# Patient Record
Sex: Female | Born: 1950 | Race: White | Hispanic: Refuse to answer | Marital: Single | Smoking: Never smoker
Health system: Southern US, Community
[De-identification: ages and names within clinical notes are randomized; demographics above are authoritative.]

## PROBLEM LIST (undated history)

## (undated) DIAGNOSIS — E1165 Type 2 diabetes mellitus with hyperglycemia: Secondary | ICD-10-CM

## (undated) DIAGNOSIS — I10 Essential (primary) hypertension: Secondary | ICD-10-CM

## (undated) DIAGNOSIS — E039 Hypothyroidism, unspecified: Secondary | ICD-10-CM

## (undated) DIAGNOSIS — E785 Hyperlipidemia, unspecified: Secondary | ICD-10-CM

## (undated) DIAGNOSIS — F329 Major depressive disorder, single episode, unspecified: Secondary | ICD-10-CM

## (undated) DIAGNOSIS — N200 Calculus of kidney: Secondary | ICD-10-CM

## (undated) DIAGNOSIS — F32A Depression, unspecified: Secondary | ICD-10-CM

## (undated) HISTORY — DX: Type 2 diabetes mellitus with hyperglycemia: E11.65

## (undated) HISTORY — DX: Hypothyroidism, unspecified: E03.9

## (undated) HISTORY — DX: Hyperlipidemia, unspecified: E78.5

## (undated) HISTORY — DX: Depression, unspecified: F32.A

## (undated) HISTORY — DX: Essential (primary) hypertension: I10

## (undated) HISTORY — DX: Calculus of kidney: N20.0

## (undated) HISTORY — DX: Major depressive disorder, single episode, unspecified: F32.9

---
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
## 2006-04-17 DIAGNOSIS — E1165 Type 2 diabetes mellitus with hyperglycemia: Secondary | ICD-10-CM

## 2006-04-17 DIAGNOSIS — IMO0002 Reserved for concepts with insufficient information to code with codable children: Secondary | ICD-10-CM

## 2006-04-17 HISTORY — DX: Reserved for concepts with insufficient information to code with codable children: IMO0002

## 2006-04-17 HISTORY — DX: Type 2 diabetes mellitus with hyperglycemia: E11.65

## 2012-09-12 ENCOUNTER — Ambulatory Visit (INDEPENDENT_AMBULATORY_CARE_PROVIDER_SITE_OTHER): Payer: 59 | Admitting: Internal Medicine

## 2012-09-12 ENCOUNTER — Encounter: Payer: Self-pay | Admitting: Internal Medicine

## 2012-09-12 VITALS — BP 124/82 | HR 72 | Temp 97.9°F | Ht 64.5 in | Wt 181.0 lb

## 2012-09-12 DIAGNOSIS — F329 Major depressive disorder, single episode, unspecified: Secondary | ICD-10-CM | POA: Insufficient documentation

## 2012-09-12 DIAGNOSIS — E1129 Type 2 diabetes mellitus with other diabetic kidney complication: Secondary | ICD-10-CM | POA: Insufficient documentation

## 2012-09-12 DIAGNOSIS — R053 Chronic cough: Secondary | ICD-10-CM | POA: Insufficient documentation

## 2012-09-12 DIAGNOSIS — E1165 Type 2 diabetes mellitus with hyperglycemia: Secondary | ICD-10-CM | POA: Insufficient documentation

## 2012-09-12 DIAGNOSIS — E785 Hyperlipidemia, unspecified: Secondary | ICD-10-CM | POA: Insufficient documentation

## 2012-09-12 DIAGNOSIS — E782 Mixed hyperlipidemia: Secondary | ICD-10-CM | POA: Insufficient documentation

## 2012-09-12 DIAGNOSIS — E039 Hypothyroidism, unspecified: Secondary | ICD-10-CM | POA: Insufficient documentation

## 2012-09-12 DIAGNOSIS — I10 Essential (primary) hypertension: Secondary | ICD-10-CM

## 2012-09-12 DIAGNOSIS — R05 Cough: Secondary | ICD-10-CM

## 2012-09-12 DIAGNOSIS — Z23 Encounter for immunization: Secondary | ICD-10-CM

## 2012-09-12 MED ORDER — METFORMIN HCL 1000 MG PO TABS: 1000.0000 mg | ORAL_TABLET | Freq: Two times a day (BID) | ORAL | Status: DC

## 2012-09-12 MED ORDER — PRAVASTATIN SODIUM 80 MG PO TABS: 80.0000 mg | ORAL_TABLET | Freq: Every day | ORAL | Status: DC

## 2012-09-12 MED ORDER — VALSARTAN-HYDROCHLOROTHIAZIDE 160-25 MG PO TABS: 1.0000 | ORAL_TABLET | Freq: Every day | ORAL | Status: DC

## 2012-09-12 MED ORDER — GLUCOSE BLOOD VI STRP: 1.0000 | ORAL_STRIP | Freq: Every day | Status: DC

## 2012-09-12 MED ORDER — METOPROLOL SUCCINATE ER 50 MG PO TB24: 50.0000 mg | ORAL_TABLET | Freq: Every day | ORAL | Status: DC

## 2012-09-12 MED ORDER — FENOFIBRATE 54 MG PO TABS: 54.0000 mg | ORAL_TABLET | Freq: Every day | ORAL | Status: DC

## 2012-09-12 MED ORDER — SERTRALINE HCL 100 MG PO TABS: 100.0000 mg | ORAL_TABLET | Freq: Every day | ORAL | Status: DC

## 2012-09-12 NOTE — Assessment & Plan Note (Signed)
Patient's blood pressure is well-controlled however her chronic dry cough maybe secondary to ACE inhibitor. Discontinue lisinopril. Change to valsartan 160 mg.  Monitor electrolytes and kidney function. BP: 124/82 mmHg

## 2012-09-12 NOTE — Assessment & Plan Note (Signed)
Patient has mild wheezing on exam. Discontinue ACE inhibitor. Check chest x-ray. Switch to ARB

## 2012-09-12 NOTE — Assessment & Plan Note (Signed)
Patient has had type 2 diabetes since 2008. She has poor dietary compliance. She does not check her blood sugars on regular basis. Provided glucometer. Patient advised to check her fasting morning blood sugars. Continue metformin 1 g twice daily. Stressed importance of dietary compliance. Refer for diabetic counseling. Check A1c and microalbumin to creatinine ratio. Refer to ophthalmologist for diabetic eye exam.  Discontinue sulfonylurea for now. We discussed adding additional oral agents at next office visit.

## 2012-09-12 NOTE — Assessment & Plan Note (Signed)
Patient has been on Zoloft for greater than 10 years. She does not recall whether she started Zoloft after her first divorce or other life stressor.  Maintain same dose of sertraline.

## 2012-09-12 NOTE — Progress Notes (Signed)
Subjective:    Patient ID: Dawn Huang, female    DOB: July 09, 1950, 62 y.o.   MRN: [id number]  HPI  62 year old white female with history of type 2 diabetes, hypertension, hyperlipidemia hypothyroidism to establish. Patient recently moved from [place] to [facility] Patient reports being diagnosed with type 2 diabetes in 2008. Patient reports her blood sugars are not well-controlled. She does not monitor her blood sugar on a regular basis. She does not recall her last A1c. She denies knowledge of any diabetic complications.  Patient not compliant with diabetic diet.  She has mild tingling in her feet.  Patient currently taking metformin 1 g twice daily. She occasionally has loose stools after lunch. She ran out of her glipizide. She reports history of low blood sugars in the past. However she has not experienced any hypoglycemia recently.  Hypertension-her blood pressures are well controlled. She's been on lisinopril for several years. She has experienced dry chronic cough or several months. She denies any chest pain or shortness of breath. She has family history of coronary artery disease. She underwent stress testing in 2007/2008. Stress testing was normal.  Preventative health care-patient has not had mammogram, colon cancer screening, or routine Pap and pelvics  Review of Systems  Constitutional: Negative for activity change, appetite change, mild weight loss Eyes: Negative for visual disturbance.  Respiratory: Negative for chest tightness and shortness of breath.   Cardiovascular: Negative for chest pain.  Genitourinary: Negative for difficulty urinating.  Neurological: Negative for headaches.  Gastrointestinal: Negative for abdominal pain, heartburn melena or hematochezia Psych: Negative for depression or anxiety.  Patient has been on sertraline for many years Endo:  No polyuria or polydypsia    Past Medical History  Diagnosis Date  . Hyperlipidemia   . Hypertension    . Diabetes mellitus type 2, uncontrolled 2008  . Hypothyroidism   . Depression   . Nephrolithiasis     History   Social History  . Marital Status: Single    Spouse Name: N/A    Number of Children: N/A  . Years of Education: N/A   Occupational History  . Not on file.   Social History Main Topics  . Smoking status: Never Smoker   . Smokeless tobacco: Not on file  . Alcohol Use: No  . Drug Use: No  . Sexually Active: Not on file   Other Topics Concern  . Not on file   Social History Narrative   Patient is divorced, she was married twice in the past. She has a son age 62, daughter age 31 who lives in [place], daughter age 37 that lives in [place]   Patient moved to [place] area from [place] 2008    Past Surgical History  Procedure Laterality Date  . Tonsillectomy  1968    Family History  Problem Relation Age of Onset  . Coronary artery disease Father   . Coronary artery disease Brother   . Breast cancer Daughter     No Known Allergies  No current outpatient prescriptions on file prior to visit.   No current facility-administered medications on file prior to visit.    BP 124/82  Pulse 72  Temp(Src) 97.9 F (36.6 C) (Oral)  Ht 5' 4.5" (1.638 m)  Wt 181 lb (82.101 kg)  BMI 30.6 kg/m2       Objective:   Physical Exam  Constitutional: She is oriented to person, place, and time. She appears well-developed and well-nourished. No distress.  HENT:  Head: Normocephalic and  atraumatic.  Right Ear: External ear normal.  Left Ear: External ear normal.  Mouth/Throat: Oropharynx is clear and moist.  Eyes: Conjunctivae and EOM are normal. Pupils are equal, round, and reactive to light.  Neck: Neck supple.  No carotid bruit  Cardiovascular: Normal rate, regular rhythm and normal heart sounds.   No murmur heard. Pulmonary/Chest: Effort normal and breath sounds normal. She has no wheezes.  Abdominal: Soft. Bowel sounds are normal. She exhibits no mass.  There is no tenderness.  Musculoskeletal: She exhibits no edema.  Neurological: She is alert and oriented to person, place, and time. No cranial nerve deficit.  Skin: Skin is warm and dry.  Diabetic foot exam-no cracks or fissures, normal lower extremity pulses, mild decrease in temperature and vibration   Psychiatric: She has a normal mood and affect. Her behavior is normal.          Assessment & Plan:

## 2012-09-12 NOTE — Patient Instructions (Addendum)
Please complete the following lab tests before your next follow up appointment: BMET, A1C, microalbumin / cr ratio, CBCD - 250.02 TSH - 244.9 FLP, LFTs - 272.4

## 2012-09-12 NOTE — Assessment & Plan Note (Signed)
Monitor TFTs

## 2012-09-12 NOTE — Assessment & Plan Note (Signed)
Continue pravastatin and fenofibrate for now. Check fasting lipid panel and LFTs.

## 2012-09-13 ENCOUNTER — Telehealth: Payer: Self-pay | Admitting: Internal Medicine

## 2012-09-13 NOTE — Telephone Encounter (Signed)
BMET, A1C, microalbumin / cr ratio, CBCD - 250.02  TSH - 244.9  FLP, LFTs - 272.4  Please schedule lab appt

## 2012-09-13 NOTE — Telephone Encounter (Signed)
PT called and stated that Dr. Artist Pais requested that she have labs done, she doesn't know which ones. Please assist.

## 2012-09-16 ENCOUNTER — Other Ambulatory Visit (INDEPENDENT_AMBULATORY_CARE_PROVIDER_SITE_OTHER): Payer: 59

## 2012-09-16 DIAGNOSIS — E785 Hyperlipidemia, unspecified: Secondary | ICD-10-CM

## 2012-09-16 DIAGNOSIS — E039 Hypothyroidism, unspecified: Secondary | ICD-10-CM

## 2012-09-16 DIAGNOSIS — IMO0001 Reserved for inherently not codable concepts without codable children: Secondary | ICD-10-CM

## 2012-09-16 LAB — TSH: TSH: 8.66 u[IU]/mL — ABNORMAL HIGH (ref 0.35–5.50)

## 2012-09-16 LAB — HEPATIC FUNCTION PANEL
ALT: 23 U/L (ref 0–35)
AST: 22 U/L (ref 0–37)
Bilirubin, Direct: 0 mg/dL (ref 0.0–0.3)
Total Bilirubin: 1 mg/dL (ref 0.3–1.2)
Total Protein: 7.5 g/dL (ref 6.0–8.3)

## 2012-09-16 LAB — CBC WITH DIFFERENTIAL/PLATELET
Basophils Relative: 1 % (ref 0.0–3.0)
Eosinophils Relative: 4.7 % (ref 0.0–5.0)
HCT: 35.3 % — ABNORMAL LOW (ref 36.0–46.0)
MCV: 87.9 fl (ref 78.0–100.0)
Monocytes Absolute: 0.5 10*3/uL (ref 0.1–1.0)
Monocytes Relative: 8.7 % (ref 3.0–12.0)
Neutrophils Relative %: 61.5 % (ref 43.0–77.0)
RBC: 4.02 Mil/uL (ref 3.87–5.11)
WBC: 5.5 10*3/uL (ref 4.5–10.5)

## 2012-09-16 LAB — BASIC METABOLIC PANEL
Chloride: 106 mEq/L (ref 96–112)
GFR: 40.86 mL/min — ABNORMAL LOW (ref >60.00)
Potassium: 5 mEq/L (ref 3.5–5.1)

## 2012-09-16 LAB — LIPID PANEL
Cholesterol: 174 mg/dL (ref 0–200)
HDL: 47.6 mg/dL (ref >39.00)
Total CHOL/HDL Ratio: 4
Triglycerides: 206 mg/dL — ABNORMAL HIGH (ref 0.0–149.0)
VLDL: 41.2 mg/dL — ABNORMAL HIGH (ref 0.0–40.0)

## 2012-09-16 LAB — MICROALBUMIN / CREATININE URINE RATIO: Microalb Creat Ratio: 0.6 mg/g (ref 0.0–30.0)

## 2012-09-23 ENCOUNTER — Ambulatory Visit (INDEPENDENT_AMBULATORY_CARE_PROVIDER_SITE_OTHER)
Admission: RE | Admit: 2012-09-23 | Discharge: 2012-09-23 | Disposition: A | Discharge status: Home / Self Care | Payer: 59 | Source: Ambulatory Visit | Attending: Internal Medicine | Admitting: Internal Medicine

## 2012-09-23 DIAGNOSIS — R05 Cough: Secondary | ICD-10-CM

## 2012-10-02 ENCOUNTER — Encounter: Payer: Self-pay | Admitting: Internal Medicine

## 2012-10-03 ENCOUNTER — Ambulatory Visit: Payer: 59 | Admitting: Internal Medicine

## 2012-10-09 ENCOUNTER — Telehealth: Payer: Self-pay | Admitting: Internal Medicine

## 2012-10-09 MED ORDER — SERTRALINE HCL 100 MG PO TABS: 100.0000 mg | ORAL_TABLET | Freq: Every day | ORAL | Status: DC

## 2012-10-09 MED ORDER — LEVOTHYROXINE SODIUM 88 MCG PO TABS: 88.0000 ug | ORAL_TABLET | Freq: Every day | ORAL | Status: DC

## 2012-10-09 NOTE — Telephone Encounter (Signed)
rx's sent in electronically 

## 2012-10-09 NOTE — Telephone Encounter (Signed)
Pt called and stated that [facility] did not receive sertraline (ZOLOFT) 100 MG tablet, or her levothyroxine (SYNTHROID, LEVOTHROID) 88 MCG tablet. Please assist .

## 2012-10-15 ENCOUNTER — Telehealth: Payer: Self-pay | Admitting: Internal Medicine

## 2012-10-15 MED ORDER — SERTRALINE HCL 100 MG PO TABS: 100.0000 mg | ORAL_TABLET | Freq: Every day | ORAL | Status: DC

## 2012-10-15 MED ORDER — LEVOTHYROXINE SODIUM 88 MCG PO TABS: 88.0000 ug | ORAL_TABLET | Freq: Every day | ORAL | Status: DC

## 2012-10-15 NOTE — Telephone Encounter (Signed)
rx's resent electronically

## 2012-10-15 NOTE — Telephone Encounter (Signed)
PT called and stated that [facility] never received her RX 's of sertraline (ZOLOFT) 100 MG tablet, and levothyroxine (SYNTHROID, LEVOTHROID) 88 MCG tablet. Please re-send.

## 2012-10-17 ENCOUNTER — Telehealth: Payer: Self-pay | Admitting: Internal Medicine

## 2012-10-17 MED ORDER — SERTRALINE HCL 100 MG PO TABS: 100.0000 mg | ORAL_TABLET | Freq: Every day | ORAL | Status: DC

## 2012-10-17 MED ORDER — LEVOTHYROXINE SODIUM 88 MCG PO TABS: 88.0000 ug | ORAL_TABLET | Freq: Every day | ORAL | Status: DC

## 2012-10-17 NOTE — Telephone Encounter (Signed)
PT called and stated that her levothyroxine (SYNTHROID, LEVOTHROID) 88 MCG tablet, and sertraline (ZOLOFT) 100 MG tablet should be sent to [facility], as this is her new pharmacy. Please make her chart reflect these changes as well.

## 2012-10-17 NOTE — Telephone Encounter (Signed)
Chart updated and sent in electronically

## 2012-11-20 ENCOUNTER — Other Ambulatory Visit: Payer: Self-pay

## 2013-01-03 ENCOUNTER — Telehealth: Payer: Self-pay | Admitting: Internal Medicine

## 2013-01-03 MED ORDER — METFORMIN HCL 1000 MG PO TABS: 1000.0000 mg | ORAL_TABLET | Freq: Two times a day (BID) | ORAL | Status: DC

## 2013-01-03 NOTE — Telephone Encounter (Signed)
Pt is requesting a 1 month refill of her metFORMIN (GLUCOPHAGE) 1000 MG tablet, be sent to [facility]. Please assist.

## 2013-01-03 NOTE — Telephone Encounter (Signed)
done

## 2013-01-23 ENCOUNTER — Encounter: Payer: Self-pay | Admitting: Internal Medicine

## 2013-01-23 ENCOUNTER — Ambulatory Visit (INDEPENDENT_AMBULATORY_CARE_PROVIDER_SITE_OTHER): Payer: 59 | Admitting: Internal Medicine

## 2013-01-23 VITALS — BP 130/84 | HR 76 | Temp 98.2°F | Resp 16 | Ht 64.5 in | Wt 179.0 lb

## 2013-01-23 DIAGNOSIS — E039 Hypothyroidism, unspecified: Secondary | ICD-10-CM

## 2013-01-23 DIAGNOSIS — E785 Hyperlipidemia, unspecified: Secondary | ICD-10-CM

## 2013-01-23 DIAGNOSIS — I10 Essential (primary) hypertension: Secondary | ICD-10-CM

## 2013-01-23 DIAGNOSIS — Z23 Encounter for immunization: Secondary | ICD-10-CM

## 2013-01-23 MED ORDER — LIRAGLUTIDE 18 MG/3ML ~~LOC~~ SOPN: 1.2000 mg | PEN_INJECTOR | Freq: Every day | SUBCUTANEOUS | Status: DC

## 2013-01-23 MED ORDER — INSULIN PEN NEEDLE 31G X 8 MM MISC: Status: DC

## 2013-01-23 NOTE — Assessment & Plan Note (Signed)
Patient's ACE inhibitor discontinued and started on valsartan HCTZ. She mistakenly did not discontinue HydroDIURIL. Patient advised discontinue HydroDIURIL. Monitor electrolytes and kidney function. Patient also advised to monitor home blood pressure readings. If systolic blood pressure greater than 140, we may need to add low-dose amlodipine. BP: 130/84 mmHg

## 2013-01-23 NOTE — Progress Notes (Signed)
Subjective:    Patient ID: Dawn Huang, female    DOB: [birth year], 62 y.o.   MRN: [id number]  HPI  62 year old white female with history of uncontrolled type 2 diabetes, hypertension and hypothyroidism for followup. Patient's metformin dose was decreased to once a day. Patient's serum creatinine was 1.4. Medication reconciliation was performed by nursing staff. Patient's ACE inhibitor was switched to Diovan hydrochlorothiazide. Patient continued her HydroDIURIL.  Patient reports mild intermittent dizziness.  Despite discontinuing ACE inhibitor and switching to ARB, patient still reports intermittent dry cough. She has history of gastroesophageal reflux disease.  She has not been checking her blood sugars regularly. They have not changed much. She has decreased her carbohydrate intake and discontinued all soft drinks.  Hypothyroidism-her levothyroxine dose was increased due to elevated TSH.  Patient reports she will be receiving flu vaccine at work on 10/22  Review of Systems Negative for chest pain or shortness of breath, intermittent numbness in her toes bilaterally      Past Medical History  Diagnosis Date  . Hyperlipidemia   . Hypertension   . Diabetes mellitus type 2, uncontrolled 2008  . Hypothyroidism   . Depression   . Nephrolithiasis     History   Social History  . Marital Status: Single    Spouse Name: N/A    Number of Children: N/A  . Years of Education: N/A   Occupational History  . Not on file.   Social History Main Topics  . Smoking status: Never Smoker   . Smokeless tobacco: Not on file  . Alcohol Use: No  . Drug Use: No  . Sexual Activity: Not on file   Other Topics Concern  . Not on file   Social History Narrative   Patient is divorced, she was married twice in the past. She has a son age 40, daughter age 66 who lives in [place], daughter age 69 that lives in [place]   Patient moved to [place] area from [place] 2008    Past Surgical History   Procedure Laterality Date  . Tonsillectomy  1968    Family History  Problem Relation Age of Onset  . Coronary artery disease Father   . Coronary artery disease Brother   . Breast cancer Daughter     No Known Allergies  Current Outpatient Prescriptions on File Prior to Visit  Medication Sig Dispense Refill  . fenofibrate 54 MG tablet Take 1 tablet (54 mg total) by mouth daily.  90 tablet  1  . glucose blood (FREESTYLE LITE) test strip 1 each by Other route daily. Use as instructed  100 each  3  . levothyroxine (SYNTHROID, LEVOTHROID) 88 MCG tablet Take 1 tablet (88 mcg total) by mouth daily before breakfast.  90 tablet  1  . metFORMIN (GLUCOPHAGE) 1000 MG tablet Take 1 tablet (1,000 mg total) by mouth 2 (two) times daily with a meal.  180 tablet  1  . metoprolol succinate (TOPROL-XL) 50 MG 24 hr tablet Take 1 tablet (50 mg total) by mouth daily.  90 tablet  1  . pravastatin (PRAVACHOL) 80 MG tablet Take 1 tablet (80 mg total) by mouth daily.  90 tablet  1  . sertraline (ZOLOFT) 100 MG tablet Take 1 tablet (100 mg total) by mouth daily.  90 tablet  1  . valsartan-hydrochlorothiazide (DIOVAN-HCT) 160-25 MG per tablet Take 1 tablet by mouth daily.  90 tablet  1   No current facility-administered medications on file prior to visit.    BP  130/84  Pulse 76  Temp(Src) 98.2 F (36.8 C)  Resp 16  Ht 5' 4.5" (1.638 m)  Wt 179 lb (81.194 kg)  BMI 30.26 kg/m2    Objective:   Physical Exam  Constitutional: She is oriented to person, place, and time. She appears well-developed and well-nourished.  Neck: Neck supple.  Cardiovascular: Normal rate, regular rhythm, normal heart sounds and intact distal pulses.   No murmur heard. Pulmonary/Chest: Effort normal and breath sounds normal. She has no wheezes.  Musculoskeletal: She exhibits no edema.  Lymphadenopathy:    She has no cervical adenopathy.  Neurological: She is alert and oriented to person, place, and time. No cranial nerve  deficit.  Skin: Skin is warm and dry.  See diabetic foot exam  Psychiatric: She has a normal mood and affect. Her behavior is normal.          Assessment & Plan:

## 2013-01-23 NOTE — Assessment & Plan Note (Signed)
Levothyroxine dose increased to 88 mcg. Monitor TFTs

## 2013-01-23 NOTE — Patient Instructions (Addendum)
Please stop hydrodiuril (HCTZ) 25 mg Monitor your blood pressure Bring your glucometer to your next follow up appointment Bring all of your pill bottles / medications to your next follow up appointment Take Victoza 0.6 mg Burton once daily for 1 week, then increase to 1.2 mg  once daily

## 2013-01-23 NOTE — Assessment & Plan Note (Signed)
Patient's blood sugar is suboptimally controlled. Metformin dose decreased to borderline elevated serum creatinine. Add Victoza. Patient given sample and instructions on how to properly use Victoza pen.  We discussed common side effects. Monitor A1c.  Reassess in 1 month.  Patient advised to bring her glucometer to her next followup office visit. Patient deferred influenza vaccine today.   She reports she'll be receiving it from her employer on October 22.

## 2013-01-23 NOTE — Assessment & Plan Note (Signed)
No change in medication.   Lab Results  Component Value Date   CHOL 174 09/16/2012   HDL 47.60 09/16/2012   LDLDIRECT 100.5 09/16/2012   TRIG 206.0* 09/16/2012   CHOLHDL 4 09/16/2012   Lab Results  Component Value Date   ALT 23 09/16/2012   AST 22 09/16/2012   ALKPHOS 39 09/16/2012   BILITOT 1.0 09/16/2012

## 2013-01-24 LAB — BASIC METABOLIC PANEL
CO2: 26 mEq/L (ref 19–32)
Chloride: 105 mEq/L (ref 96–112)
Sodium: 142 mEq/L (ref 135–145)

## 2013-01-24 LAB — TSH: TSH: 4.26 u[IU]/mL (ref 0.35–5.50)

## 2013-01-24 LAB — HEMOGLOBIN A1C: Hgb A1c MFr Bld: 7.6 % — ABNORMAL HIGH (ref 4.6–6.5)

## 2013-01-24 MED ORDER — LEVOTHYROXINE SODIUM 112 MCG PO TABS: 112.0000 ug | ORAL_TABLET | Freq: Every day | ORAL | Status: DC

## 2013-01-24 NOTE — Addendum Note (Signed)
Addended by: Meda Coffee on: 01/24/2013 03:42 PM   Modules accepted: Orders

## 2013-01-28 ENCOUNTER — Other Ambulatory Visit: Payer: Self-pay | Admitting: Internal Medicine

## 2013-01-28 MED ORDER — LEVOTHYROXINE SODIUM 112 MCG PO TABS: 112.0000 ug | ORAL_TABLET | Freq: Every day | ORAL | Status: DC

## 2013-02-05 ENCOUNTER — Encounter: Payer: Self-pay | Admitting: Internal Medicine

## 2013-02-05 NOTE — Telephone Encounter (Signed)
Pls advise.  

## 2013-02-06 ENCOUNTER — Other Ambulatory Visit: Payer: Self-pay | Admitting: *Deleted

## 2013-02-06 MED ORDER — EXENATIDE 5 MCG/0.02ML ~~LOC~~ SOPN: 5.0000 ug | PEN_INJECTOR | Freq: Two times a day (BID) | SUBCUTANEOUS | Status: DC

## 2013-02-06 NOTE — Telephone Encounter (Signed)
Rx sent to pharmacy   

## 2013-02-17 ENCOUNTER — Encounter: Payer: Self-pay | Admitting: Internal Medicine

## 2013-02-20 ENCOUNTER — Other Ambulatory Visit: Payer: Self-pay

## 2013-02-20 ENCOUNTER — Ambulatory Visit: Payer: 59 | Admitting: Internal Medicine

## 2013-02-28 ENCOUNTER — Other Ambulatory Visit: Payer: Self-pay | Admitting: *Deleted

## 2013-02-28 MED ORDER — FENOFIBRATE 54 MG PO TABS: 54.0000 mg | ORAL_TABLET | Freq: Every day | ORAL | Status: DC

## 2013-02-28 MED ORDER — METOPROLOL SUCCINATE ER 50 MG PO TB24: 50.0000 mg | ORAL_TABLET | Freq: Every day | ORAL | Status: DC

## 2013-02-28 MED ORDER — SERTRALINE HCL 100 MG PO TABS: 100.0000 mg | ORAL_TABLET | Freq: Every day | ORAL | Status: DC

## 2013-02-28 MED ORDER — PRAVASTATIN SODIUM 80 MG PO TABS: 80.0000 mg | ORAL_TABLET | Freq: Every day | ORAL | Status: DC

## 2013-02-28 MED ORDER — GLUCOSE BLOOD VI STRP: 1.0000 | ORAL_STRIP | Freq: Every day | Status: DC

## 2013-02-28 NOTE — Addendum Note (Signed)
Addended by: Alfred Levins D on: 02/28/2013 05:20 PM   Modules accepted: Orders

## 2013-03-12 ENCOUNTER — Encounter: Payer: Self-pay | Admitting: Internal Medicine

## 2013-03-26 ENCOUNTER — Other Ambulatory Visit: Payer: Self-pay | Admitting: *Deleted

## 2013-04-11 ENCOUNTER — Telehealth: Payer: Self-pay | Admitting: Internal Medicine

## 2013-04-11 NOTE — Telephone Encounter (Signed)
Pt would like you to call her and advise her what meds she needs to be on. Pt states he changed some things and she is unclear of her med list. pls call

## 2013-04-11 NOTE — Telephone Encounter (Signed)
Pt is signed up on mychart.  She is just wanting list of her medications.  Told pt to sign into to her mychart and they should be on there.  Pt verbalized understanding and had no questions

## 2013-05-26 ENCOUNTER — Encounter: Payer: Self-pay | Admitting: Internal Medicine

## 2013-05-26 ENCOUNTER — Ambulatory Visit (INDEPENDENT_AMBULATORY_CARE_PROVIDER_SITE_OTHER): Payer: 59 | Admitting: Internal Medicine

## 2013-05-26 VITALS — BP 148/92 | HR 72 | Temp 97.9°F | Ht 64.5 in | Wt 184.0 lb

## 2013-05-26 DIAGNOSIS — E11319 Type 2 diabetes mellitus with unspecified diabetic retinopathy without macular edema: Secondary | ICD-10-CM

## 2013-05-26 DIAGNOSIS — E1165 Type 2 diabetes mellitus with hyperglycemia: Secondary | ICD-10-CM

## 2013-05-26 DIAGNOSIS — IMO0002 Reserved for concepts with insufficient information to code with codable children: Secondary | ICD-10-CM

## 2013-05-26 DIAGNOSIS — E1149 Type 2 diabetes mellitus with other diabetic neurological complication: Secondary | ICD-10-CM

## 2013-05-26 DIAGNOSIS — E1139 Type 2 diabetes mellitus with other diabetic ophthalmic complication: Secondary | ICD-10-CM

## 2013-05-26 DIAGNOSIS — Z2911 Encounter for prophylactic immunotherapy for respiratory syncytial virus (RSV): Secondary | ICD-10-CM

## 2013-05-26 DIAGNOSIS — I10 Essential (primary) hypertension: Secondary | ICD-10-CM

## 2013-05-26 DIAGNOSIS — E785 Hyperlipidemia, unspecified: Secondary | ICD-10-CM

## 2013-05-26 DIAGNOSIS — E039 Hypothyroidism, unspecified: Secondary | ICD-10-CM

## 2013-05-26 DIAGNOSIS — Z23 Encounter for immunization: Secondary | ICD-10-CM

## 2013-05-26 MED ORDER — VALSARTAN-HYDROCHLOROTHIAZIDE 160-25 MG PO TABS: 1.0000 | ORAL_TABLET | Freq: Every day | ORAL | Status: DC

## 2013-05-26 MED ORDER — ATORVASTATIN CALCIUM 20 MG PO TABS: 20.0000 mg | ORAL_TABLET | Freq: Every day | ORAL | Status: DC

## 2013-05-26 MED ORDER — AMLODIPINE BESYLATE 5 MG PO TABS: 5.0000 mg | ORAL_TABLET | Freq: Every day | ORAL | Status: DC

## 2013-05-26 NOTE — Assessment & Plan Note (Signed)
Monitor TSH.  Adjust levothyroxine dose accordingly.  Lab Results  Component Value Date   TSH 4.26 01/23/2013

## 2013-05-26 NOTE — Progress Notes (Signed)
Subjective:    Patient ID: Dawn Huang, female    DOB: 1951-04-14, 63 y.o.   MRN: [id number]  HPI  63 year old white female with history of uncontrolled type 2 diabetes, hypertension and hypothyroidism for followup.  Type diabetes-patient has not been checking her blood sugar on a regular basis. Patient needs a new glucometer. Patient reports taking her medication iregularly. She often forgets to use of Victoza.  She reports having difficulty controlling her appetite.  Patient reports completing diabetic eye exam by optometrist-Dr. Conley Rolls.  Patient reported to have background diabetic retinopathy.  Official report unavailable for review.  Hypertension-patient does not monitor her blood pressure at home. She reports good medication compliance. She is on valsartan hydrochlorothiazide 160/25 mg once daily.  Her cough has significantly improved.  Hyperlipidemia-stable.  She has tried taking pravastatin 80 mg and fenofibrate.   Review of Systems Negative for chest pain, negative for visual changes    Past Medical History  Diagnosis Date  . Hyperlipidemia   . Hypertension   . Diabetes mellitus type 2, uncontrolled 2008  . Hypothyroidism   . Depression   . Nephrolithiasis     History   Social History  . Marital Status: Single    Spouse Name: N/A    Number of Children: N/A  . Years of Education: N/A   Occupational History  . Not on file.   Social History Main Topics  . Smoking status: Never Smoker   . Smokeless tobacco: Not on file  . Alcohol Use: No  . Drug Use: No  . Sexual Activity: Not on file   Other Topics Concern  . Not on file   Social History Narrative   Patient is divorced, she was married twice in the past. She has a son age 55, daughter age 44 who lives in [place], daughter age 16 that lives in [place]   Patient moved to [place] area from [place] 2008    Past Surgical History  Procedure Laterality Date  . Tonsillectomy  1968    Family History  Problem  Relation Age of Onset  . Coronary artery disease Father   . Coronary artery disease Brother   . Breast cancer Daughter     No Known Allergies  Current Outpatient Prescriptions on File Prior to Visit  Medication Sig Dispense Refill  . fenofibrate 54 MG tablet Take 1 tablet (54 mg total) by mouth daily.  90 tablet  1  . glucose blood (FREESTYLE LITE) test strip 1 each by Other route daily.  100 each  3  . Insulin Pen Needle (AURORA PEN NEEDLES) 31G X 8 MM MISC Use once daily as directed  100 each  1  . levothyroxine (SYNTHROID, LEVOTHROID) 112 MCG tablet Take 1 tablet (112 mcg total) by mouth daily before breakfast.  90 tablet  2  . Liraglutide (VICTOZA) 18 MG/3ML SOPN Inject 1.2 mg into the skin daily.  6 mL  2  . metFORMIN (GLUCOPHAGE) 1000 MG tablet Take 1 tablet (1,000 mg total) by mouth daily.  180 tablet  1  . metoprolol succinate (TOPROL-XL) 50 MG 24 hr tablet Take 1 tablet (50 mg total) by mouth daily.  90 tablet  1  . pravastatin (PRAVACHOL) 80 MG tablet Take 1 tablet (80 mg total) by mouth daily.  90 tablet  1  . sertraline (ZOLOFT) 100 MG tablet Take 1 tablet (100 mg total) by mouth daily.  90 tablet  1   No current facility-administered medications on file prior to visit.  BP 148/92  Pulse 72  Temp(Src) 97.9 F (36.6 C) (Oral)  Ht 5' 4.5" (1.638 m)  Wt 184 lb (83.462 kg)  BMI 31.11 kg/m2    Objective:   Physical Exam  Constitutional: She is oriented to person, place, and time. She appears well-developed and well-nourished. No distress.  HENT:  Head: Normocephalic and atraumatic.  Neck:  No carotid bruit  Cardiovascular: Normal rate, regular rhythm and normal heart sounds.   No murmur heard. Pulmonary/Chest: Effort normal and breath sounds normal. She has no wheezes.  Musculoskeletal: She exhibits no edema.  Neurological: She is alert and oriented to person, place, and time. No cranial nerve deficit.  Skin: Skin is warm and dry.  Psychiatric: She has a normal  mood and affect. Her behavior is normal.          Assessment & Plan:

## 2013-05-26 NOTE — Assessment & Plan Note (Signed)
Patient not checking her blood sugar as directed. She is taking Victoza intermittently. I stressed importance of following carb modified diet and taking her medication regularly. New glucometer provided. Patient reports she was seen by optometrist and reported to have background diabetic retinopathy.  [facility] diabetic retinopathy. Monitor A1c. Monitor electrolytes and kidney function. Lab Results  Component Value Date   HGBA1C 7.6* 01/23/2013

## 2013-05-26 NOTE — Assessment & Plan Note (Signed)
Patient's blood pressure is suboptimally controlled. Blood pressure with manual cuff in right arm is 160/70. Continue valsartan hydrochlorothiazide 160/25 mg. Add amlodipine 5 mg once daily. Reassess in 6 weeks. Monitor electrolytes and kidney function. BP: 148/92 mmHg  Lab Results  Component Value Date   CREATININE 1.3* 01/23/2013

## 2013-05-26 NOTE — Progress Notes (Signed)
Pre visit review using our clinic review tool, if applicable. No additional management support is needed unless otherwise documented below in the visit note. 

## 2013-05-26 NOTE — Assessment & Plan Note (Addendum)
Patient switched from pravastatin to atorvastatin.  If her triglycerides improved with improved blood sugar control, consider to discontinue fenofibrate.

## 2013-05-27 ENCOUNTER — Telehealth: Payer: Self-pay | Admitting: Internal Medicine

## 2013-05-27 LAB — BASIC METABOLIC PANEL
BUN: 20 mg/dL (ref 6–23)
CO2: 25 mEq/L (ref 19–32)
Calcium: 9.5 mg/dL (ref 8.4–10.5)
Chloride: 105 mEq/L (ref 96–112)
Creatinine, Ser: 1.1 mg/dL (ref 0.4–1.2)
GFR: 52.96 mL/min — AB (ref >60.00)
Glucose, Bld: 207 mg/dL — ABNORMAL HIGH (ref 70–99)
Potassium: 4.3 mEq/L (ref 3.5–5.1)
Sodium: 139 mEq/L (ref 135–145)

## 2013-05-27 LAB — HEMOGLOBIN A1C: HEMOGLOBIN A1C: 7.3 % — AB (ref 4.6–6.5)

## 2013-05-27 LAB — TSH: TSH: 0.17 u[IU]/mL — ABNORMAL LOW (ref 0.35–5.50)

## 2013-05-27 NOTE — Telephone Encounter (Signed)
Relevant patient education assigned to patient using Emmi. ° °

## 2013-05-28 ENCOUNTER — Encounter: Payer: Self-pay | Admitting: Internal Medicine

## 2013-05-30 ENCOUNTER — Telehealth: Payer: Self-pay

## 2013-05-30 ENCOUNTER — Other Ambulatory Visit: Payer: Self-pay | Admitting: *Deleted

## 2013-05-30 MED ORDER — LEVOTHYROXINE SODIUM 100 MCG PO TABS: 100.0000 ug | ORAL_TABLET | Freq: Every day | ORAL | Status: DC

## 2013-05-30 NOTE — Telephone Encounter (Signed)
Relevant patient education assigned to patient using Emmi. ° °

## 2013-06-04 ENCOUNTER — Ambulatory Visit: Payer: 59 | Admitting: Internal Medicine

## 2013-07-14 ENCOUNTER — Other Ambulatory Visit: Payer: Self-pay | Admitting: Internal Medicine

## 2013-07-25 ENCOUNTER — Other Ambulatory Visit: Payer: Self-pay | Admitting: Internal Medicine

## 2013-07-25 ENCOUNTER — Encounter: Payer: Self-pay | Admitting: Internal Medicine

## 2013-07-25 MED ORDER — LEVOTHYROXINE SODIUM 100 MCG PO TABS: ORAL_TABLET | ORAL | Status: DC

## 2013-07-25 MED ORDER — METFORMIN HCL 1000 MG PO TABS: 1000.0000 mg | ORAL_TABLET | Freq: Every day | ORAL | Status: DC

## 2013-07-25 MED ORDER — METFORMIN HCL 1000 MG PO TABS: ORAL_TABLET | ORAL | Status: DC

## 2013-07-25 NOTE — Telephone Encounter (Signed)
Metformin 1000mg  has been refilled and sent to [facility]

## 2013-07-31 ENCOUNTER — Other Ambulatory Visit (INDEPENDENT_AMBULATORY_CARE_PROVIDER_SITE_OTHER): Payer: 59

## 2013-07-31 DIAGNOSIS — E039 Hypothyroidism, unspecified: Secondary | ICD-10-CM

## 2013-07-31 LAB — TSH: TSH: 0.16 u[IU]/mL — ABNORMAL LOW (ref 0.35–5.50)

## 2013-08-01 ENCOUNTER — Telehealth: Payer: Self-pay | Admitting: Internal Medicine

## 2013-08-01 NOTE — Telephone Encounter (Signed)
Correct directions were faxed to [facility]

## 2013-08-01 NOTE — Telephone Encounter (Signed)
Molly from [facility] called need verification of directions on metFORMIN (GLUCOPHAGE) 1000 MG tablet can be reached at [phone] ref # [id number]

## 2013-08-14 ENCOUNTER — Other Ambulatory Visit: Payer: Self-pay | Admitting: *Deleted

## 2013-08-14 MED ORDER — LEVOTHYROXINE SODIUM 75 MCG PO TABS: 75.0000 ug | ORAL_TABLET | Freq: Every day | ORAL | Status: DC

## 2013-08-21 ENCOUNTER — Other Ambulatory Visit: Payer: Self-pay | Admitting: Internal Medicine

## 2013-08-21 MED ORDER — LEVOTHYROXINE SODIUM 75 MCG PO TABS: 75.0000 ug | ORAL_TABLET | Freq: Every day | ORAL | Status: DC

## 2013-09-04 ENCOUNTER — Encounter: Payer: Self-pay | Admitting: Internal Medicine

## 2013-09-05 MED ORDER — EXENATIDE 5 MCG/0.02ML ~~LOC~~ SOPN: 5.0000 ug | PEN_INJECTOR | Freq: Two times a day (BID) | SUBCUTANEOUS | Status: DC

## 2013-09-23 ENCOUNTER — Other Ambulatory Visit: Payer: Self-pay | Admitting: Internal Medicine

## 2013-09-30 ENCOUNTER — Other Ambulatory Visit: Payer: Self-pay | Admitting: Internal Medicine

## 2013-10-14 ENCOUNTER — Other Ambulatory Visit (INDEPENDENT_AMBULATORY_CARE_PROVIDER_SITE_OTHER): Payer: 59

## 2013-10-14 DIAGNOSIS — E039 Hypothyroidism, unspecified: Secondary | ICD-10-CM

## 2013-10-14 LAB — TSH: TSH: 5.53 u[IU]/mL — ABNORMAL HIGH (ref 0.35–4.50)

## 2013-10-20 ENCOUNTER — Encounter: Payer: Self-pay | Admitting: Internal Medicine

## 2013-10-20 ENCOUNTER — Ambulatory Visit (INDEPENDENT_AMBULATORY_CARE_PROVIDER_SITE_OTHER): Payer: 59 | Admitting: Internal Medicine

## 2013-10-20 VITALS — BP 114/70 | HR 76 | Temp 98.4°F | Ht 64.5 in | Wt 184.0 lb

## 2013-10-20 DIAGNOSIS — E1149 Type 2 diabetes mellitus with other diabetic neurological complication: Secondary | ICD-10-CM

## 2013-10-20 DIAGNOSIS — I10 Essential (primary) hypertension: Secondary | ICD-10-CM

## 2013-10-20 DIAGNOSIS — IMO0002 Reserved for concepts with insufficient information to code with codable children: Secondary | ICD-10-CM

## 2013-10-20 DIAGNOSIS — E038 Other specified hypothyroidism: Secondary | ICD-10-CM

## 2013-10-20 DIAGNOSIS — E1165 Type 2 diabetes mellitus with hyperglycemia: Principal | ICD-10-CM

## 2013-10-20 MED ORDER — LEVOTHYROXINE SODIUM 88 MCG PO TABS: 88.0000 ug | ORAL_TABLET | Freq: Every day | ORAL | Status: DC

## 2013-10-20 MED ORDER — INSULIN DETEMIR 100 UNIT/ML FLEXPEN: PEN_INJECTOR | SUBCUTANEOUS | Status: DC

## 2013-10-20 MED ORDER — LEVOTHYROXINE SODIUM 125 MCG PO TABS: 125.0000 ug | ORAL_TABLET | Freq: Every day | ORAL | Status: DC

## 2013-10-20 MED ORDER — INSULIN LISPRO 100 UNIT/ML (KWIKPEN): PEN_INJECTOR | SUBCUTANEOUS | Status: DC

## 2013-10-20 NOTE — Assessment & Plan Note (Addendum)
Patient's blood sugar significantly worse since previous visit. Her fasting blood sugars are above 350 and sometimes greater than 400. She is failing metformin and Byetta.  DC Byetta.  Start basal and meal time insulin.  Start levemir at 10 units at bedtime.  Insulin teaching provided.  10 units of levemir given in the office.  Patient instructed to titrate Levemir higher by 3 units every other day until her fasting blood sugar is less than 150.  Patient also to use Humalog with evening meal.  Start with 5 units for now.   Check BMET and A1c.

## 2013-10-20 NOTE — Progress Notes (Signed)
Subjective:    Patient ID: Dawn BondSharon Redcay, female    DOB: 03/10/1951, 63 y.o.   MRN: [id number]  Diabetes  63 year old white female with history of type 2 diabetes uncontrolled, hypertension and hypothyroidism for routine followup. Patient has been taking combination of Glucophage and Byetta.  Her blood sugars have been significantly worse since previous visit. Her morning blood sugars are greater than 350 and occasionally in the 400s.  Patient reports she is avoiding sweets but has difficulty decreasing her carbohydrate intake (especially potatoes).  Hypertension-improved. We added amlodipine at last office visit. Patient reports intermittent lightheadedness.  Review of Systems Polyuria and polydipsia, fatigue    Past Medical History  Diagnosis Date  . Hyperlipidemia   . Hypertension   . Diabetes mellitus type 2, uncontrolled 2008  . Hypothyroidism   . Depression   . Nephrolithiasis     History   Social History  . Marital Status: Single    Spouse Name: N/A    Number of Children: N/A  . Years of Education: N/A   Occupational History  . Not on file.   Social History Main Topics  . Smoking status: Never Smoker   . Smokeless tobacco: Not on file  . Alcohol Use: No  . Drug Use: No  . Sexual Activity: Not on file   Other Topics Concern  . Not on file   Social History Narrative   Patient is divorced, she was married twice in the past. She has a son age 65, daughter age 65 who lives in [place], daughter age 65 that lives in [place]   Patient moved to [place] area from [place] 2008    Past Surgical History  Procedure Laterality Date  . Tonsillectomy  1968    Family History  Problem Relation Age of Onset  . Coronary artery disease Father   . Coronary artery disease Brother   . Breast cancer Daughter     No Known Allergies  Current Outpatient Prescriptions on File Prior to Visit  Medication Sig Dispense Refill  . amLODipine (NORVASC) 5 MG tablet Take 1  tablet by mouth  daily  90 tablet  3  . atorvastatin (LIPITOR) 20 MG tablet Take 1 tablet by mouth  daily  90 tablet  3  . fenofibrate 54 MG tablet Take 1 tablet by mouth  daily  90 tablet  1  . glucose blood (FREESTYLE LITE) test strip 1 each by Other route daily.  100 each  3  . Insulin Pen Needle (AURORA PEN NEEDLES) 31G X 8 MM MISC Use once daily as directed  100 each  1  . metFORMIN (GLUCOPHAGE) 1000 MG tablet Take 1 tablet by mouth  twice a day with a meal  180 tablet  1  . metoprolol succinate (TOPROL-XL) 50 MG 24 hr tablet Take 1 tablet by mouth  daily  90 tablet  3  . sertraline (ZOLOFT) 100 MG tablet Take 1 tablet by mouth  daily  90 tablet  1  . valsartan-hydrochlorothiazide (DIOVAN-HCT) 160-25 MG per tablet Take 1 tablet by mouth  daily  90 tablet  3   No current facility-administered medications on file prior to visit.    BP 114/70  Pulse 76  Temp(Src) 98.4 F (36.9 C) (Oral)  Ht 5' 4.5" (1.638 m)  Wt 184 lb (83.462 kg)  BMI 31.11 kg/m2    Objective:   Physical Exam  Constitutional: She is oriented to person, place, and time. She appears well-developed and well-nourished.  Cardiovascular: Normal  rate, regular rhythm and normal heart sounds.   No murmur heard. Pulmonary/Chest: Effort normal and breath sounds normal. She has no wheezes.  Musculoskeletal: She exhibits no edema.  Neurological: She is alert and oriented to person, place, and time. No cranial nerve deficit.  Psychiatric: She has a normal mood and affect. Her behavior is normal.        Assessment & Plan:

## 2013-10-20 NOTE — Assessment & Plan Note (Signed)
We performed medication reconciliation. Patient has been taking  levothyroxine 112 mcg once daily. Her TSH is slightly elevated. Increase to 125 mcg once daily. Lab Results  Component Value Date   TSH 5.53* 10/14/2013

## 2013-10-20 NOTE — Assessment & Plan Note (Signed)
Improved. BP: 114/70 mmHg  I suspect her lightheadedness will improve once polyuria and polydipsia resolves.

## 2013-10-20 NOTE — Progress Notes (Signed)
Pre visit review using our clinic review tool, if applicable. No additional management support is needed unless otherwise documented below in the visit note. 

## 2013-10-20 NOTE — Patient Instructions (Signed)
Increase your Levemir dose by 3 units every other day until your fasting blood sugar is less than 150. Do not use Humalog if you are not consuming carbohydrates for your evening meal.  Your blood sugar goal 2 hrs after you eat should be around 150.

## 2013-10-21 LAB — BASIC METABOLIC PANEL
BUN: 29 mg/dL — ABNORMAL HIGH (ref 6–23)
CALCIUM: 9.5 mg/dL (ref 8.4–10.5)
CO2: 30 meq/L (ref 19–32)
Chloride: 105 mEq/L (ref 96–112)
Creatinine, Ser: 1.4 mg/dL — ABNORMAL HIGH (ref 0.4–1.2)
GFR: 39.08 mL/min — ABNORMAL LOW (ref >60.00)
Glucose, Bld: 243 mg/dL — ABNORMAL HIGH (ref 70–99)
POTASSIUM: 4.5 meq/L (ref 3.5–5.1)
Sodium: 140 mEq/L (ref 135–145)

## 2013-10-21 LAB — HEMOGLOBIN A1C: Hgb A1c MFr Bld: 8.7 % — ABNORMAL HIGH (ref 4.6–6.5)

## 2013-10-21 LAB — MICROALBUMIN / CREATININE URINE RATIO
CREATININE, U: 126.4 mg/dL
MICROALB/CREAT RATIO: 0.2 mg/g (ref 0.0–30.0)
Microalb, Ur: 0.3 mg/dL (ref 0.0–1.9)

## 2013-10-21 MED ORDER — LEVOTHYROXINE SODIUM 88 MCG PO TABS: 88.0000 ug | ORAL_TABLET | Freq: Every day | ORAL | Status: DC

## 2013-10-21 NOTE — Addendum Note (Signed)
Addended by: Peter CongoKILLINGSWORTH, Adolphus Hanf T on: 10/21/2013 11:38 AM   Modules accepted: Orders

## 2013-10-29 ENCOUNTER — Other Ambulatory Visit: Payer: Self-pay | Admitting: Internal Medicine

## 2013-10-31 ENCOUNTER — Encounter: Payer: Self-pay | Admitting: Internal Medicine

## 2013-10-31 ENCOUNTER — Ambulatory Visit (INDEPENDENT_AMBULATORY_CARE_PROVIDER_SITE_OTHER): Payer: 59 | Admitting: Internal Medicine

## 2013-10-31 VITALS — BP 136/80 | HR 64 | Temp 98.3°F | Wt 179.0 lb

## 2013-10-31 DIAGNOSIS — E785 Hyperlipidemia, unspecified: Secondary | ICD-10-CM

## 2013-10-31 DIAGNOSIS — E1165 Type 2 diabetes mellitus with hyperglycemia: Principal | ICD-10-CM

## 2013-10-31 DIAGNOSIS — E1149 Type 2 diabetes mellitus with other diabetic neurological complication: Secondary | ICD-10-CM

## 2013-10-31 DIAGNOSIS — IMO0002 Reserved for concepts with insufficient information to code with codable children: Secondary | ICD-10-CM

## 2013-10-31 MED ORDER — INSULIN DETEMIR 100 UNIT/ML FLEXPEN: PEN_INJECTOR | SUBCUTANEOUS | Status: DC

## 2013-10-31 NOTE — Assessment & Plan Note (Addendum)
Patient's blood sugars improving. Increase Levemir to 30 units at bedtime and continue to use 5-10 units of Humalog before evening meal. Refer to diabetic educator.  Hypoglycemia teaching reviewed with patient.  Patient encouraged to contact office via MyChart with blood sugar readings next week.  Reassess in 3 weeks.  Lab Results  Component Value Date   HGBA1C 8.7* 10/20/2013   HGBA1C 7.3* 05/26/2013   HGBA1C 7.6* 01/23/2013   Lab Results  Component Value Date   MICROALBUR 0.3 10/20/2013   CREATININE 1.4* 10/20/2013

## 2013-10-31 NOTE — Assessment & Plan Note (Addendum)
Patient very close to goal.  Maintain same dose of atorvastatin.  Low saturated fat diet handout provided. Plan to repeat LDL within next 1-2 months.

## 2013-10-31 NOTE — Progress Notes (Signed)
Subjective:    Patient ID: Dawn Huang, female    DOB: 05/31/1950, 63 y.o.   MRN: [id number]  Diabetes  63 year old white female for followup regarding uncontrolled type 2 diabetes. Patient currently using 15 units of Levemir and 5-10 units of Humalog before evening meal. Her fasting blood sugars are still uses greater than 200-250. Last night her postprandial blood sugar was approximately 160 after using 10 units of Humalog.  She has not been to diabetic educator.  Review of Systems Negative for hypoglycemia   Past Medical History  Diagnosis Date  . Hyperlipidemia   . Hypertension   . Diabetes mellitus type 2, uncontrolled 2008  . Hypothyroidism   . Depression   . Nephrolithiasis     History   Social History  . Marital Status: Single    Spouse Name: N/A    Number of Children: N/A  . Years of Education: N/A   Occupational History  . Not on file.   Social History Main Topics  . Smoking status: Never Smoker   . Smokeless tobacco: Not on file  . Alcohol Use: No  . Drug Use: No  . Sexual Activity: Not on file   Other Topics Concern  . Not on file   Social History Narrative   Patient is divorced, she was married twice in the past. She has a son age 53, daughter age 5 who lives in [place], daughter age 57 that lives in [place]   Patient moved to [place] area from [place] 2008    Past Surgical History  Procedure Laterality Date  . Tonsillectomy  1968    Family History  Problem Relation Age of Onset  . Coronary artery disease Father   . Coronary artery disease Brother   . Breast cancer Daughter     No Known Allergies  Current Outpatient Prescriptions on File Prior to Visit  Medication Sig Dispense Refill  . amLODipine (NORVASC) 5 MG tablet Take 1 tablet by mouth  daily  90 tablet  3  . atorvastatin (LIPITOR) 20 MG tablet Take 1 tablet by mouth  daily  90 tablet  3  . fenofibrate 54 MG tablet Take 1 tablet by mouth  daily  90 tablet  1  . glucose  blood (FREESTYLE LITE) test strip 1 each by Other route daily.  100 each  3  . insulin lispro (HUMALOG) 100 UNIT/ML KiwkPen Use 5 to 10 units ten minutes before your evening meal  5 pen  5  . Insulin Pen Needle (AURORA PEN NEEDLES) 31G X 8 MM MISC Use once daily as directed  100 each  1  . levothyroxine (SYNTHROID, LEVOTHROID) 88 MCG tablet Take 1 tablet (88 mcg total) by mouth daily.  30 tablet  2  . metFORMIN (GLUCOPHAGE) 1000 MG tablet Take 1 tablet by mouth  twice a day with a meal  180 tablet  1  . metoprolol succinate (TOPROL-XL) 50 MG 24 hr tablet Take 1 tablet by mouth  daily  90 tablet  3  . sertraline (ZOLOFT) 100 MG tablet Take 1 tablet by mouth  daily  90 tablet  1  . valsartan-hydrochlorothiazide (DIOVAN-HCT) 160-25 MG per tablet Take 1 tablet by mouth  daily  90 tablet  3   No current facility-administered medications on file prior to visit.    BP 136/80  Pulse 64  Temp(Src) 98.3 F (36.8 C) (Oral)  Wt 179 lb (81.194 kg)      Objective:   Physical Exam  Constitutional:  She is oriented to person, place, and time. She appears well-developed and well-nourished.  Cardiovascular: Normal rate, regular rhythm and normal heart sounds.   Pulmonary/Chest: Effort normal and breath sounds normal. She has no wheezes.  Neurological: She is alert and oriented to person, place, and time. No cranial nerve deficit.  Psychiatric: She has a normal mood and affect. Her behavior is normal.          Assessment & Plan:

## 2013-10-31 NOTE — Progress Notes (Signed)
Pre visit review using our clinic review tool, if applicable. No additional management support is needed unless otherwise documented below in the visit note. 

## 2013-11-06 ENCOUNTER — Telehealth: Payer: Self-pay | Admitting: Internal Medicine

## 2013-11-06 NOTE — Telephone Encounter (Signed)
Message via mychart

## 2013-11-06 NOTE — Telephone Encounter (Signed)
I received this message from the pt, pt is needing to know does she take more insulin.   i received another insulin shipment yesterday that i am to take once a day. do i take that with the other two that i am now taking?

## 2013-11-10 ENCOUNTER — Encounter: Payer: Self-pay | Admitting: Internal Medicine

## 2013-11-11 ENCOUNTER — Other Ambulatory Visit: Payer: Self-pay | Admitting: *Deleted

## 2013-11-12 ENCOUNTER — Encounter: Payer: Self-pay | Admitting: Internal Medicine

## 2013-11-12 ENCOUNTER — Ambulatory Visit (INDEPENDENT_AMBULATORY_CARE_PROVIDER_SITE_OTHER): Payer: 59 | Admitting: Internal Medicine

## 2013-11-12 VITALS — BP 114/64 | HR 76 | Temp 98.3°F | Ht 64.5 in | Wt 180.0 lb

## 2013-11-12 DIAGNOSIS — E1149 Type 2 diabetes mellitus with other diabetic neurological complication: Secondary | ICD-10-CM

## 2013-11-12 DIAGNOSIS — R112 Nausea with vomiting, unspecified: Secondary | ICD-10-CM

## 2013-11-12 DIAGNOSIS — E1165 Type 2 diabetes mellitus with hyperglycemia: Secondary | ICD-10-CM

## 2013-11-12 DIAGNOSIS — I10 Essential (primary) hypertension: Secondary | ICD-10-CM

## 2013-11-12 DIAGNOSIS — R1013 Epigastric pain: Secondary | ICD-10-CM

## 2013-11-12 DIAGNOSIS — IMO0002 Reserved for concepts with insufficient information to code with codable children: Secondary | ICD-10-CM

## 2013-11-12 LAB — CBC WITH DIFFERENTIAL/PLATELET
BASOS PCT: 0.5 % (ref 0.0–3.0)
Basophils Absolute: 0 10*3/uL (ref 0.0–0.1)
Eosinophils Absolute: 0.3 10*3/uL (ref 0.0–0.7)
Eosinophils Relative: 5.5 % — ABNORMAL HIGH (ref 0.0–5.0)
HCT: 33 % — ABNORMAL LOW (ref 36.0–46.0)
HEMOGLOBIN: 11.3 g/dL — AB (ref 12.0–15.0)
LYMPHS PCT: 21.9 % (ref 12.0–46.0)
Lymphs Abs: 1.1 10*3/uL (ref 0.7–4.0)
MCHC: 34.3 g/dL (ref 30.0–36.0)
MCV: 88.2 fl (ref 78.0–100.0)
MONOS PCT: 8.5 % (ref 3.0–12.0)
Monocytes Absolute: 0.4 10*3/uL (ref 0.1–1.0)
NEUTROS ABS: 3.1 10*3/uL (ref 1.4–7.7)
Neutrophils Relative %: 63.6 % (ref 43.0–77.0)
Platelets: 195 10*3/uL (ref 150.0–400.0)
RBC: 3.74 Mil/uL — AB (ref 3.87–5.11)
RDW: 14.6 % (ref 11.5–15.5)
WBC: 4.9 10*3/uL (ref 4.0–10.5)

## 2013-11-12 LAB — HEPATIC FUNCTION PANEL
ALBUMIN: 4.3 g/dL (ref 3.5–5.2)
ALK PHOS: 47 U/L (ref 39–117)
ALT: 24 U/L (ref 0–35)
AST: 27 U/L (ref 0–37)
Bilirubin, Direct: 0.1 mg/dL (ref 0.0–0.3)
TOTAL PROTEIN: 7.6 g/dL (ref 6.0–8.3)
Total Bilirubin: 0.9 mg/dL (ref 0.2–1.2)

## 2013-11-12 LAB — BASIC METABOLIC PANEL
BUN: 27 mg/dL — ABNORMAL HIGH (ref 6–23)
CO2: 30 mEq/L (ref 19–32)
Calcium: 10.2 mg/dL (ref 8.4–10.5)
Chloride: 102 mEq/L (ref 96–112)
Creatinine, Ser: 1.4 mg/dL — ABNORMAL HIGH (ref 0.4–1.2)
GFR: 39.71 mL/min — AB (ref >60.00)
Glucose, Bld: 196 mg/dL — ABNORMAL HIGH (ref 70–99)
Potassium: 4.7 mEq/L (ref 3.5–5.1)
SODIUM: 138 meq/L (ref 135–145)

## 2013-11-12 LAB — LIPASE: Lipase: 37 U/L (ref 11.0–59.0)

## 2013-11-12 NOTE — Progress Notes (Signed)
Pre visit review using our clinic review tool, if applicable. No additional management support is needed unless otherwise documented below in the visit note. 

## 2013-11-12 NOTE — Assessment & Plan Note (Signed)
Patient's blood pressure is low normal and he is experiencing intermittent lightheadedness. Decrease valsartan hydrochlorothiazide dose by half.  BP: 114/64 mmHg

## 2013-11-12 NOTE — Assessment & Plan Note (Addendum)
63 year old white female with unexplained nausea vomiting and epigastric pain. She has minimal tenderness on exam. Obtain liver function tests, lipase, CBC with differential. Rule out gallbladder disease - obtain abdominal RUQ ultrasound.  Her symptoms resolving w/o intervention.  Patient held metformin as directed.  Restart medication at 500 mg bid with food.  Resume full dose only after GI symptoms completely resolved.

## 2013-11-12 NOTE — Assessment & Plan Note (Signed)
Blood sugar reading improving.  Increase Levemir and Humalog dose.

## 2013-11-12 NOTE — Progress Notes (Signed)
Subjective:    Patient ID: Dawn Huang, female    DOB: 1950/05/17, 63 y.o.   MRN: [id number]  HPI  63 year old white female with history of uncontrolled type 2 diabetes, hypertension and hyperlipidemia for followup. At previous visit we increased her dose of Levemir and added mealtime insulin.  Patient reports suffering from nausea and vomiting 4-5 days ago. She had difficulty keeping down any food. She held her insulin for 2 days. Her symptoms gradually seemed to improve. She complains of epigastric discomfort. It feels like "gas bubble".  Hypertension - occasionally feeling lightheaded.  Review of Systems Negative for fever or chills, negative for diarrhea    Past Medical History  Diagnosis Date  . Hyperlipidemia   . Hypertension   . Diabetes mellitus type 2, uncontrolled 2008  . Hypothyroidism   . Depression   . Nephrolithiasis     History   Social History  . Marital Status: Single    Spouse Name: N/A    Number of Children: N/A  . Years of Education: N/A   Occupational History  . Not on file.   Social History Main Topics  . Smoking status: Never Smoker   . Smokeless tobacco: Not on file  . Alcohol Use: No  . Drug Use: No  . Sexual Activity: Not on file   Other Topics Concern  . Not on file   Social History Narrative   Patient is divorced, she was married twice in the past. She has a son age 87, daughter age 53 who lives in [place], daughter age 28 that lives in [place]   Patient moved to [place] area from [place] 2008    Past Surgical History  Procedure Laterality Date  . Tonsillectomy  1968    Family History  Problem Relation Age of Onset  . Coronary artery disease Father   . Coronary artery disease Brother   . Breast cancer Daughter     No Known Allergies  Current Outpatient Prescriptions on File Prior to Visit  Medication Sig Dispense Refill  . amLODipine (NORVASC) 5 MG tablet Take 1 tablet by mouth  daily  90 tablet  3  . atorvastatin  (LIPITOR) 20 MG tablet Take 1 tablet by mouth  daily  90 tablet  3  . fenofibrate 54 MG tablet Take 1 tablet by mouth  daily  90 tablet  1  . glucose blood (FREESTYLE LITE) test strip 1 each by Other route daily.  100 each  3  . Insulin Pen Needle (AURORA PEN NEEDLES) 31G X 8 MM MISC Use once daily as directed  100 each  1  . levothyroxine (SYNTHROID, LEVOTHROID) 88 MCG tablet Take 1 tablet (88 mcg total) by mouth daily.  30 tablet  2  . metFORMIN (GLUCOPHAGE) 1000 MG tablet Take 1 tablet by mouth  twice a day with a meal  180 tablet  1  . metoprolol succinate (TOPROL-XL) 50 MG 24 hr tablet Take 1 tablet by mouth  daily  90 tablet  3  . sertraline (ZOLOFT) 100 MG tablet Take 1 tablet by mouth  daily  90 tablet  1   No current facility-administered medications on file prior to visit.    BP 114/64  Pulse 76  Temp(Src) 98.3 F (36.8 C) (Oral)  Ht 5' 4.5" (1.638 m)  Wt 180 lb (81.647 kg)  BMI 30.43 kg/m2    Objective:   Physical Exam  Constitutional: She is oriented to person, place, and time. She appears well-developed and well-nourished.  HENT:  Head: Normocephalic and atraumatic.  Mouth/Throat: Oropharynx is clear and moist.  Cardiovascular: Normal rate, regular rhythm and normal heart sounds.   No murmur heard. Pulmonary/Chest: Effort normal and breath sounds normal. She has no wheezes.  Abdominal: Soft. She exhibits no mass. There is no rebound and no guarding.  Mild epigastric tenderness   Musculoskeletal: She exhibits no edema.  Neurological: She is alert and oriented to person, place, and time. No cranial nerve deficit.  Skin: Skin is warm and dry.  Psychiatric: She has a normal mood and affect. Her behavior is normal.          Assessment & Plan:

## 2013-11-21 ENCOUNTER — Ambulatory Visit
Admission: RE | Admit: 2013-11-21 | Discharge: 2013-11-21 | Disposition: A | Discharge status: Home / Self Care | Payer: 59 | Source: Ambulatory Visit | Attending: Internal Medicine | Admitting: Internal Medicine

## 2013-11-21 DIAGNOSIS — R1013 Epigastric pain: Secondary | ICD-10-CM

## 2013-11-21 DIAGNOSIS — R112 Nausea with vomiting, unspecified: Secondary | ICD-10-CM

## 2013-11-28 ENCOUNTER — Ambulatory Visit: Payer: 59 | Admitting: Internal Medicine

## 2013-12-25 ENCOUNTER — Other Ambulatory Visit: Payer: 59

## 2014-01-02 ENCOUNTER — Other Ambulatory Visit: Payer: Self-pay | Admitting: Internal Medicine

## 2014-02-11 ENCOUNTER — Ambulatory Visit (INDEPENDENT_AMBULATORY_CARE_PROVIDER_SITE_OTHER): Payer: 59 | Admitting: Family Medicine

## 2014-02-11 ENCOUNTER — Encounter: Payer: Self-pay | Admitting: Family Medicine

## 2014-02-11 VITALS — BP 134/82 | HR 71 | Temp 98.3°F | Ht 64.5 in | Wt 182.0 lb

## 2014-02-11 DIAGNOSIS — J0100 Acute maxillary sinusitis, unspecified: Secondary | ICD-10-CM

## 2014-02-11 MED ORDER — AZITHROMYCIN 250 MG PO TABS: ORAL_TABLET | ORAL | Status: DC

## 2014-02-11 NOTE — Progress Notes (Signed)
Pre visit review using our clinic review tool, if applicable. No additional management support is needed unless otherwise documented below in the visit note. 

## 2014-02-11 NOTE — Progress Notes (Signed)
   Subjective:    Patient ID: Dawn BondSharon Huang, female    DOB: 11/05/1950, 63 y.o.   MRN: [id number]  HPI Here for 4 days of sinus pressure, PND, ST, and a dry cough.    Review of Systems  Constitutional: Negative.   HENT: Positive for congestion, postnasal drip and sinus pressure.   Eyes: Negative.   Respiratory: Positive for cough.        Objective:   Physical Exam  Constitutional: She appears well-developed and well-nourished.  HENT:  Right Ear: External ear normal.  Left Ear: External ear normal.  Nose: Nose normal.  Mouth/Throat: Oropharynx is clear and moist.  Eyes: Conjunctivae are normal.  Pulmonary/Chest: Effort normal and breath sounds normal.  Lymphadenopathy:    She has no cervical adenopathy.          Assessment & Plan:  Add Mucinex. Written out of work today and tomorrow.

## 2014-02-18 ENCOUNTER — Telehealth: Payer: Self-pay | Admitting: Internal Medicine

## 2014-02-18 MED ORDER — METFORMIN HCL 1000 MG PO TABS: ORAL_TABLET | ORAL | Status: DC

## 2014-02-18 NOTE — Telephone Encounter (Signed)
rx sent in electronically 

## 2014-02-18 NOTE — Telephone Encounter (Signed)
[facility] - [place], [place] - [address] is requesting re-fill on metFORMIN (GLUCOPHAGE) 1000 MG tablet

## 2014-03-18 ENCOUNTER — Other Ambulatory Visit: Payer: Self-pay | Admitting: Internal Medicine

## 2014-03-22 ENCOUNTER — Other Ambulatory Visit: Payer: Self-pay | Admitting: Internal Medicine

## 2014-05-13 ENCOUNTER — Ambulatory Visit: Payer: Self-pay | Admitting: Internal Medicine

## 2014-05-18 ENCOUNTER — Encounter: Payer: Self-pay | Admitting: Internal Medicine

## 2014-07-15 ENCOUNTER — Ambulatory Visit: Payer: Self-pay | Admitting: Internal Medicine

## 2014-07-22 ENCOUNTER — Encounter: Payer: Self-pay | Admitting: Internal Medicine

## 2014-07-22 ENCOUNTER — Ambulatory Visit (INDEPENDENT_AMBULATORY_CARE_PROVIDER_SITE_OTHER): Payer: 59 | Admitting: Internal Medicine

## 2014-07-22 VITALS — BP 124/72 | Temp 98.3°F | Ht 64.5 in | Wt 186.0 lb

## 2014-07-22 DIAGNOSIS — I10 Essential (primary) hypertension: Secondary | ICD-10-CM

## 2014-07-22 DIAGNOSIS — F329 Major depressive disorder, single episode, unspecified: Secondary | ICD-10-CM

## 2014-07-22 DIAGNOSIS — IMO0002 Reserved for concepts with insufficient information to code with codable children: Secondary | ICD-10-CM

## 2014-07-22 DIAGNOSIS — E1149 Type 2 diabetes mellitus with other diabetic neurological complication: Secondary | ICD-10-CM

## 2014-07-22 DIAGNOSIS — E1141 Type 2 diabetes mellitus with diabetic mononeuropathy: Secondary | ICD-10-CM

## 2014-07-22 DIAGNOSIS — E039 Hypothyroidism, unspecified: Secondary | ICD-10-CM | POA: Diagnosis not present

## 2014-07-22 DIAGNOSIS — E1165 Type 2 diabetes mellitus with hyperglycemia: Secondary | ICD-10-CM

## 2014-07-22 DIAGNOSIS — E785 Hyperlipidemia, unspecified: Secondary | ICD-10-CM | POA: Diagnosis not present

## 2014-07-22 DIAGNOSIS — F430 Acute stress reaction: Secondary | ICD-10-CM

## 2014-07-22 DIAGNOSIS — F32A Depression, unspecified: Secondary | ICD-10-CM

## 2014-07-22 MED ORDER — BAYER MICROLET LANCETS MISC: 1.0000 | Freq: Three times a day (TID) | Status: AC

## 2014-07-22 MED ORDER — INSULIN LISPRO 100 UNIT/ML (KWIKPEN): PEN_INJECTOR | SUBCUTANEOUS | Status: DC

## 2014-07-22 MED ORDER — INSULIN PEN NEEDLE 31G X 8 MM MISC: Status: DC

## 2014-07-22 MED ORDER — VALSARTAN-HYDROCHLOROTHIAZIDE 160-25 MG PO TABS: 0.5000 | ORAL_TABLET | Freq: Every day | ORAL | Status: DC

## 2014-07-22 MED ORDER — INSULIN DETEMIR 100 UNIT/ML FLEXPEN: PEN_INJECTOR | SUBCUTANEOUS | Status: DC

## 2014-07-22 MED ORDER — FENOFIBRATE 54 MG PO TABS: ORAL_TABLET | ORAL | Status: DC

## 2014-07-22 MED ORDER — LIRAGLUTIDE 18 MG/3ML ~~LOC~~ SOPN: PEN_INJECTOR | SUBCUTANEOUS | Status: DC

## 2014-07-22 MED ORDER — GLUCOSE BLOOD VI STRP: 1.0000 | ORAL_STRIP | Freq: Three times a day (TID) | Status: AC

## 2014-07-22 MED ORDER — ATORVASTATIN CALCIUM 20 MG PO TABS: ORAL_TABLET | ORAL | Status: DC

## 2014-07-22 MED ORDER — SERTRALINE HCL 100 MG PO TABS: ORAL_TABLET | ORAL | Status: DC

## 2014-07-22 MED ORDER — METFORMIN HCL 1000 MG PO TABS: ORAL_TABLET | ORAL | Status: DC

## 2014-07-22 MED ORDER — AMLODIPINE BESYLATE 5 MG PO TABS: 5.0000 mg | ORAL_TABLET | Freq: Every day | ORAL | Status: DC

## 2014-07-22 MED ORDER — METOPROLOL SUCCINATE ER 50 MG PO TB24: ORAL_TABLET | ORAL | Status: DC

## 2014-07-22 NOTE — Assessment & Plan Note (Signed)
Monitor TFTs.  Adjust thyroid replacement dose accordingly.

## 2014-07-22 NOTE — Assessment & Plan Note (Signed)
Patient will restart checking her blood sugars 3 times daily. Depending on postprandial blood sugars, patient will restart mealtime insulin. She understands she may require lower dose.

## 2014-07-22 NOTE — Progress Notes (Signed)
Subjective:    Patient ID: Dawn Huang, female    DOB: 04-17-1951, 64 y.o.   MRN: [id number]  HPI  64 year old white female with history of uncontrolled type 2 diabetes, hypertension, depression and hypothyroidism for follow-up.  Has been up approximately 9 months since her last follow-up visit. Patient reports significant new life stressors. Her daughter who is 16 years old diagnosed with metastatic breast cancer. Her daughter lives in [place].  It has been 3 months since she last checked her blood sugar. She continues to take her medications and long-acting insulin. She ran out of her mealtime insulin. She denies any hypoglycemia.  Depression-she is taking sertraline 100 mg once daily. She is having difficulty managing stress reaction.  Hypertension - stable  Review of Systems Negative for chest pain.  Negative for changes in vision. Negative for paresthesias    Past Medical History  Diagnosis Date  . Hyperlipidemia   . Hypertension   . Diabetes mellitus type 2, uncontrolled 2008  . Hypothyroidism   . Depression   . Nephrolithiasis     History   Social History  . Marital Status: Single    Spouse Name: N/A  . Number of Children: N/A  . Years of Education: N/A   Occupational History  . Not on file.   Social History Main Topics  . Smoking status: Never Smoker   . Smokeless tobacco: Never Used  . Alcohol Use: No  . Drug Use: No  . Sexual Activity: Not on file   Other Topics Concern  . Not on file   Social History Narrative   Patient is divorced, she was married twice in the past. She has a son age 2, daughter age 51 who lives in [place], daughter age 36 that lives in [place]   Patient moved to [place] area from [place] 2008    Past Surgical History  Procedure Laterality Date  . Tonsillectomy  1968    Family History  Problem Relation Age of Onset  . Coronary artery disease Father   . Coronary artery disease Brother   . Breast cancer Daughter     No  Known Allergies  Current Outpatient Prescriptions on File Prior to Visit  Medication Sig Dispense Refill  . amLODipine (NORVASC) 5 MG tablet Take 1 tablet by mouth  daily 90 tablet 3  . atorvastatin (LIPITOR) 20 MG tablet Take 1 tablet by mouth  daily 90 tablet 3  . fenofibrate 54 MG tablet Take 1 tablet by mouth  daily 90 tablet 1  . glucose blood (FREESTYLE LITE) test strip 1 each by Other route daily. 100 each 3  . Insulin Detemir (LEVEMIR) 100 UNIT/ML Pen Use 35 units at bedtime as directed 5 pen 5  . insulin lispro (HUMALOG) 100 UNIT/ML KiwkPen Use 10 to 15 units ten minutes before your evening meal 5 pen 5  . Insulin Pen Needle (AURORA PEN NEEDLES) 31G X 8 MM MISC Use once daily as directed 100 each 1  . levothyroxine (SYNTHROID, LEVOTHROID) 125 MCG tablet Take 1 tablet by mouth  daily 90 tablet 1  . metFORMIN (GLUCOPHAGE) 1000 MG tablet Take 1 tablet by mouth  twice a day with a meal 180 tablet 1  . metoprolol succinate (TOPROL-XL) 50 MG 24 hr tablet Take 1 tablet by mouth  daily 90 tablet 3  . sertraline (ZOLOFT) 100 MG tablet Take 1 tablet by mouth  daily 90 tablet 1  . valsartan-hydrochlorothiazide (DIOVAN-HCT) 160-25 MG per tablet Take 0.5 tablets by mouth  daily. 90 tablet 3  . VICTOZA 18 MG/3ML SOPN Inject 1.2 mg into the skin daily. 18 mL 3   No current facility-administered medications on file prior to visit.    BP 124/72 mmHg  Temp(Src) 98.3 F (36.8 C) (Oral)  Ht 5' 4.5" (1.638 m)  Wt 186 lb (84.369 kg)  BMI 31.45 kg/m2    Objective:   Physical Exam  Constitutional: She is oriented to person, place, and time. She appears well-developed and well-nourished. No distress.  HENT:  Head: Normocephalic and atraumatic.  Neck: Neck supple.  No carotid bruit  Cardiovascular: Normal rate, regular rhythm and normal heart sounds.   No murmur heard. Pulmonary/Chest: Effort normal and breath sounds normal. She has no wheezes.  Musculoskeletal: She exhibits no edema.    Neurological: She is alert and oriented to person, place, and time. No cranial nerve deficit.  Skin: Skin is warm and dry.  Psychiatric: She has a normal mood and affect.          Assessment & Plan:

## 2014-07-22 NOTE — Assessment & Plan Note (Signed)
Patient experiencing significant stress reaction. Her 66 year old daughter recently diagnosed with metastatic breast cancer. Refer to behavioral health for counseling. Continue same dose of sertraline.

## 2014-07-22 NOTE — Assessment & Plan Note (Signed)
BP well controlled.  No change in medication.  Monitor electrolytes and kidney function. BP: 124/72 mmHg

## 2014-07-22 NOTE — Progress Notes (Signed)
Pre visit review using our clinic review tool, if applicable. No additional management support is needed unless otherwise documented below in the visit note. 

## 2014-07-22 NOTE — Patient Instructions (Signed)
Bring your blood sugar log or your glucometer to your next appointment

## 2014-07-23 ENCOUNTER — Other Ambulatory Visit: Payer: Self-pay | Admitting: Internal Medicine

## 2014-07-23 LAB — LIPID PANEL
Cholesterol: 155 mg/dL (ref 0–200)
HDL: 49.4 mg/dL (ref >39.00)
LDL Cholesterol: 67 mg/dL (ref 0–99)
NonHDL: 105.6
Total CHOL/HDL Ratio: 3
Triglycerides: 195 mg/dL — ABNORMAL HIGH (ref 0.0–149.0)
VLDL: 39 mg/dL (ref 0.0–40.0)

## 2014-07-23 LAB — CBC WITH DIFFERENTIAL/PLATELET
BASOS ABS: 0 10*3/uL (ref 0.0–0.1)
Basophils Relative: 0.5 % (ref 0.0–3.0)
Eosinophils Absolute: 0.4 10*3/uL (ref 0.0–0.7)
Eosinophils Relative: 5.1 % — ABNORMAL HIGH (ref 0.0–5.0)
HCT: 34.8 % — ABNORMAL LOW (ref 36.0–46.0)
HEMOGLOBIN: 11.9 g/dL — AB (ref 12.0–15.0)
LYMPHS ABS: 1.6 10*3/uL (ref 0.7–4.0)
LYMPHS PCT: 22.8 % (ref 12.0–46.0)
MCHC: 34.2 g/dL (ref 30.0–36.0)
MCV: 87.4 fl (ref 78.0–100.0)
MONO ABS: 0.4 10*3/uL (ref 0.1–1.0)
Monocytes Relative: 5.8 % (ref 3.0–12.0)
Neutro Abs: 4.6 10*3/uL (ref 1.4–7.7)
Neutrophils Relative %: 65.8 % (ref 43.0–77.0)
Platelets: 190 10*3/uL (ref 150.0–400.0)
RBC: 3.98 Mil/uL (ref 3.87–5.11)
RDW: 14.5 % (ref 11.5–15.5)
WBC: 6.9 10*3/uL (ref 4.0–10.5)

## 2014-07-23 LAB — BASIC METABOLIC PANEL
BUN: 26 mg/dL — ABNORMAL HIGH (ref 6–23)
CHLORIDE: 103 meq/L (ref 96–112)
CO2: 28 meq/L (ref 19–32)
Calcium: 9.9 mg/dL (ref 8.4–10.5)
Creatinine, Ser: 1.33 mg/dL — ABNORMAL HIGH (ref 0.40–1.20)
GFR: 42.39 mL/min — ABNORMAL LOW (ref >60.00)
GLUCOSE: 184 mg/dL — AB (ref 70–99)
POTASSIUM: 4.1 meq/L (ref 3.5–5.1)
SODIUM: 139 meq/L (ref 135–145)

## 2014-07-23 LAB — MICROALBUMIN / CREATININE URINE RATIO
CREATININE, U: 153.8 mg/dL
MICROALB UR: 1 mg/dL (ref 0.0–1.9)
MICROALB/CREAT RATIO: 0.7 mg/g (ref 0.0–30.0)

## 2014-07-23 LAB — HEPATIC FUNCTION PANEL
ALT: 15 U/L (ref 0–35)
AST: 15 U/L (ref 0–37)
Albumin: 4.5 g/dL (ref 3.5–5.2)
Alkaline Phosphatase: 56 U/L (ref 39–117)
Bilirubin, Direct: 0.2 mg/dL (ref 0.0–0.3)
Total Bilirubin: 0.5 mg/dL (ref 0.2–1.2)
Total Protein: 7.7 g/dL (ref 6.0–8.3)

## 2014-07-23 LAB — TSH: TSH: 4.5 u[IU]/mL (ref 0.35–4.50)

## 2014-07-23 LAB — HEMOGLOBIN A1C: Hgb A1c MFr Bld: 7.6 % — ABNORMAL HIGH (ref 4.6–6.5)

## 2014-07-23 MED ORDER — LEVOTHYROXINE SODIUM 137 MCG PO TABS: 137.0000 ug | ORAL_TABLET | Freq: Every day | ORAL | Status: DC

## 2014-07-24 ENCOUNTER — Other Ambulatory Visit: Payer: Self-pay | Admitting: *Deleted

## 2014-07-24 MED ORDER — VALSARTAN-HYDROCHLOROTHIAZIDE 160-25 MG PO TABS: 0.5000 | ORAL_TABLET | Freq: Every day | ORAL | Status: DC

## 2014-08-12 ENCOUNTER — Encounter: Payer: Self-pay | Admitting: Family Medicine

## 2014-08-12 ENCOUNTER — Ambulatory Visit (INDEPENDENT_AMBULATORY_CARE_PROVIDER_SITE_OTHER): Payer: 59 | Admitting: Family Medicine

## 2014-08-12 ENCOUNTER — Ambulatory Visit: Payer: 59 | Admitting: Internal Medicine

## 2014-08-12 VITALS — BP 127/74 | HR 70 | Temp 98.3°F | Ht 64.5 in | Wt 184.0 lb

## 2014-08-12 DIAGNOSIS — E1141 Type 2 diabetes mellitus with diabetic mononeuropathy: Secondary | ICD-10-CM | POA: Diagnosis not present

## 2014-08-12 DIAGNOSIS — E038 Other specified hypothyroidism: Secondary | ICD-10-CM | POA: Diagnosis not present

## 2014-08-12 DIAGNOSIS — E1165 Type 2 diabetes mellitus with hyperglycemia: Secondary | ICD-10-CM

## 2014-08-12 DIAGNOSIS — I10 Essential (primary) hypertension: Secondary | ICD-10-CM | POA: Diagnosis not present

## 2014-08-12 DIAGNOSIS — E1149 Type 2 diabetes mellitus with other diabetic neurological complication: Secondary | ICD-10-CM

## 2014-08-12 DIAGNOSIS — J0100 Acute maxillary sinusitis, unspecified: Secondary | ICD-10-CM | POA: Diagnosis not present

## 2014-08-12 DIAGNOSIS — IMO0002 Reserved for concepts with insufficient information to code with codable children: Secondary | ICD-10-CM

## 2014-08-12 MED ORDER — AZITHROMYCIN 250 MG PO TABS: ORAL_TABLET | ORAL | Status: DC

## 2014-08-12 NOTE — Progress Notes (Signed)
   Subjective:    Patient ID: Dawn Huang, female    DOB: 11/21/1950, 64 y.o.   MRN: [id number]  HPI Here to follow up on several issues and also for a sinus infection. For the past week she has had sinus pressure, PND, and a dry cough. At her last visit with Dr. Artist PaisYoo she was started on Humalog and she takes 15 units before her supper every night. She is on her usual dos eof Levemir at bedtime. Since then her am fasting glucoses have come down to 110 to 130. Her BP is stable.    Review of Systems  Constitutional: Negative.   HENT: Positive for congestion, postnasal drip and sinus pressure.   Eyes: Negative.   Respiratory: Positive for cough and chest tightness.        Objective:   Physical Exam  Constitutional: She appears well-developed and well-nourished.  HENT:  Right Ear: External ear normal.  Left Ear: External ear normal.  Nose: Nose normal.  Mouth/Throat: Oropharynx is clear and moist.  Eyes: Conjunctivae are normal.  Neck: No thyromegaly present.  Pulmonary/Chest: Effort normal and breath sounds normal. No respiratory distress. She has no wheezes. She has no rales.  Lymphadenopathy:    She has no cervical adenopathy.          Assessment & Plan:  Treat the sinusitis with a Zpack. Her diabetes and HTN are now well controlled.

## 2014-08-12 NOTE — Progress Notes (Signed)
Pre visit review using our clinic review tool, if applicable. No additional management support is needed unless otherwise documented below in the visit note. 

## 2014-08-17 ENCOUNTER — Encounter: Payer: Self-pay | Admitting: Internal Medicine

## 2014-08-18 MED ORDER — HYDROCODONE-HOMATROPINE 5-1.5 MG/5ML PO SYRP: 5.0000 mL | ORAL_SOLUTION | ORAL | Status: DC | PRN

## 2014-08-18 NOTE — Telephone Encounter (Signed)
done

## 2014-09-23 ENCOUNTER — Ambulatory Visit: Payer: 59 | Admitting: Internal Medicine

## 2014-09-25 ENCOUNTER — Other Ambulatory Visit: Payer: Self-pay | Admitting: Internal Medicine

## 2014-10-12 LAB — HM DIABETES EYE EXAM

## 2014-10-13 ENCOUNTER — Encounter: Payer: Self-pay | Admitting: Internal Medicine

## 2014-10-31 LAB — HM MAMMOGRAPHY

## 2014-11-05 ENCOUNTER — Encounter: Payer: Self-pay | Admitting: Internal Medicine

## 2014-11-11 IMAGING — US US ABDOMEN LIMITED
1 series · 14 of 25 positions shown · non-contrast
Comparison: None.

CLINICAL DATA: Upper abdominal pain with nausea and vomiting

EXAM:
US ABDOMEN LIMITED - RIGHT UPPER QUADRANT

[Series 1: us abdomen limited · 0.32mm/px · 14 of 42 slices shown]
[im 1/42]
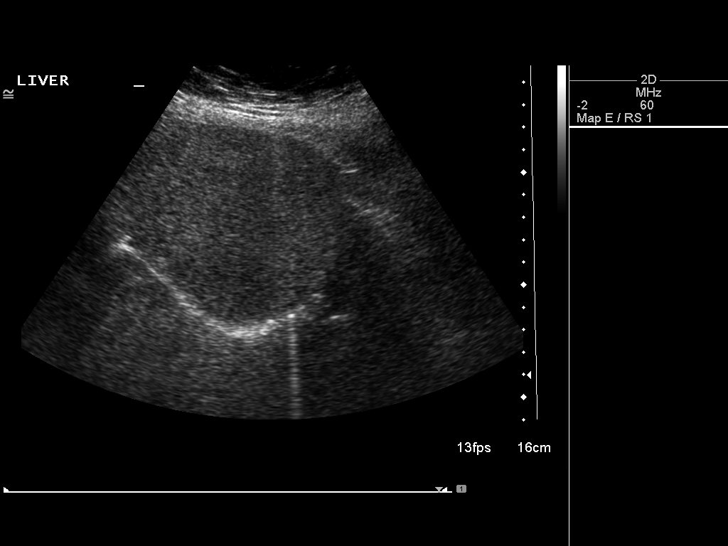
[im 4/42]
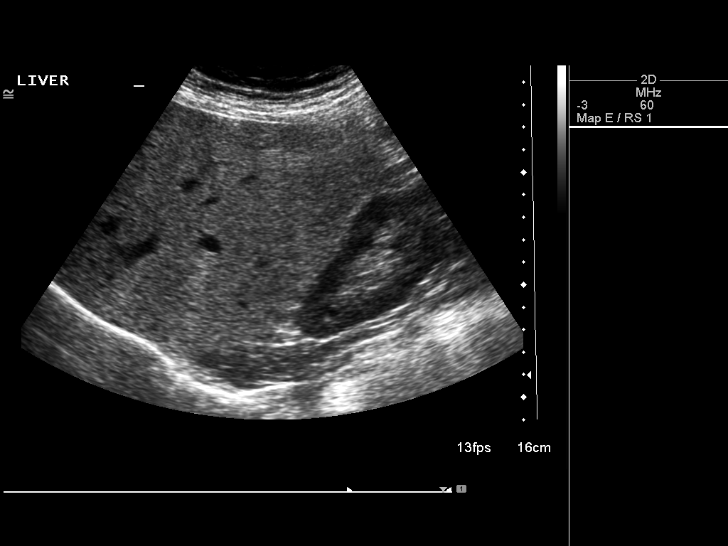
[im 7/42]
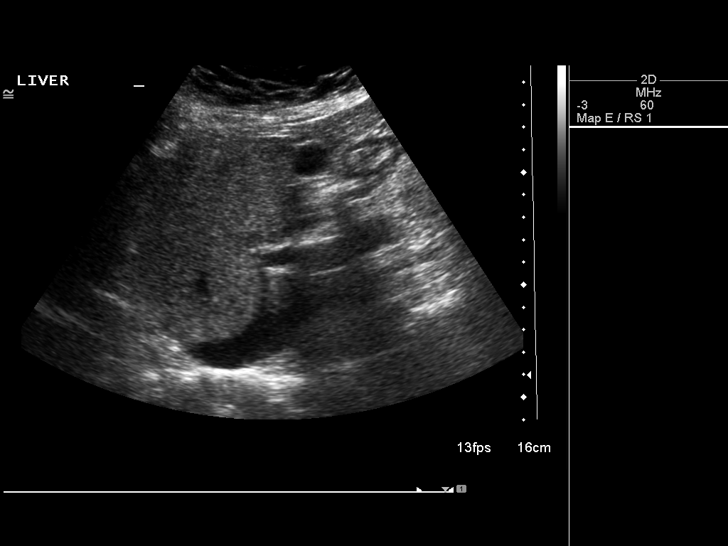
[im 11/42]
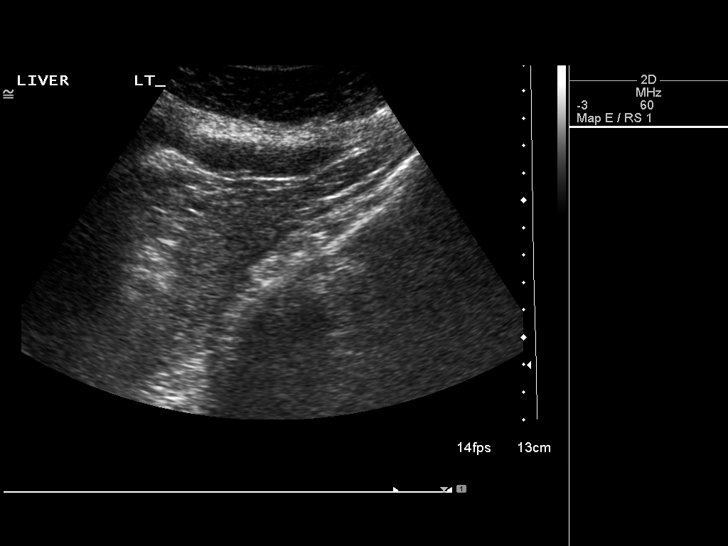
[im 14/42]
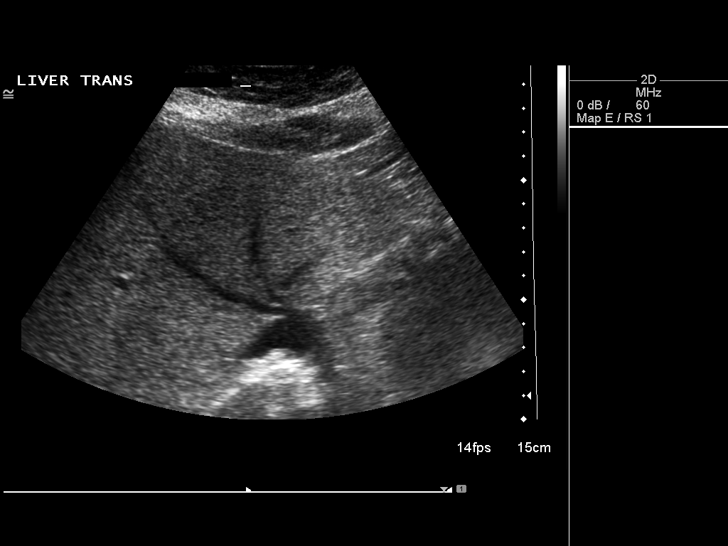
[im 16/42]
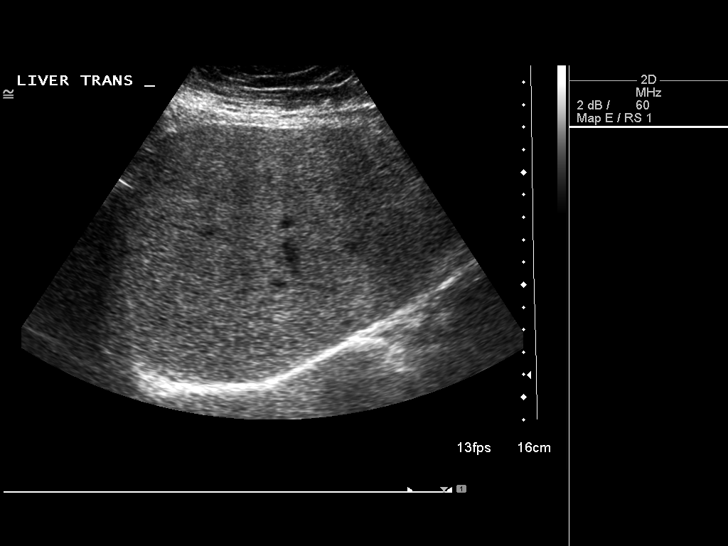
[im 19/42]
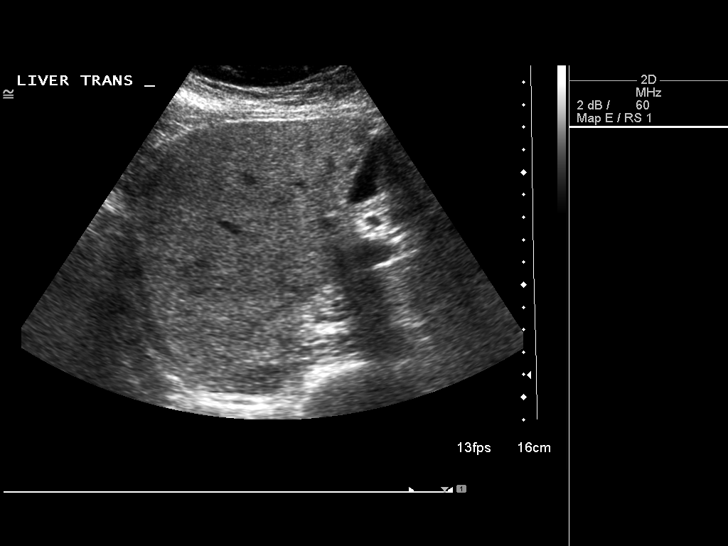
[im 23/42]
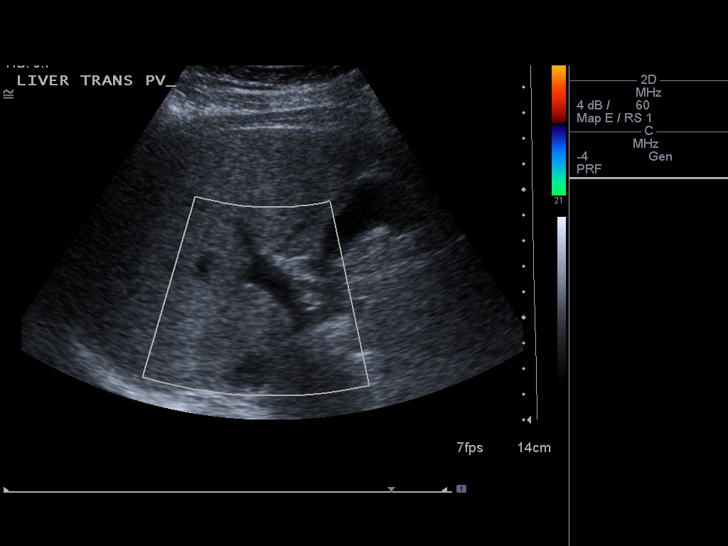
[im 26/42]
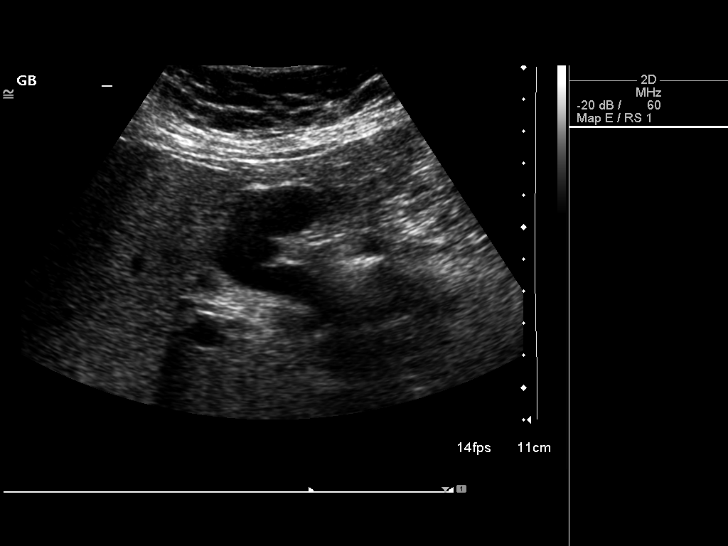
[im 28/42]
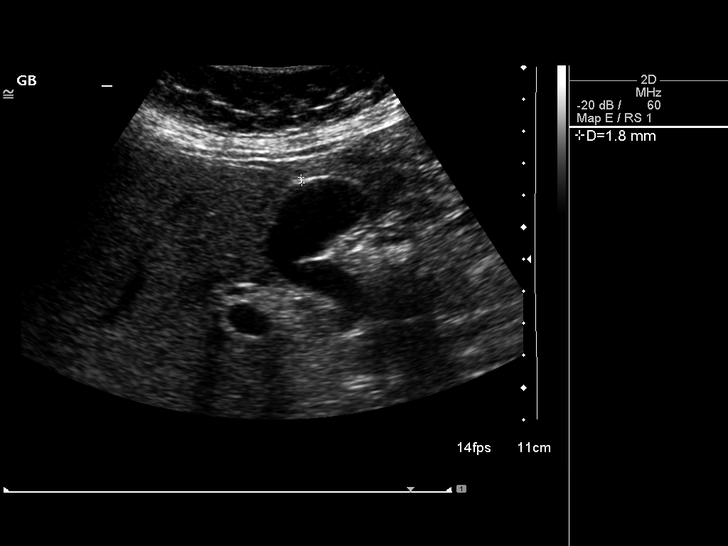
[im 31/42]
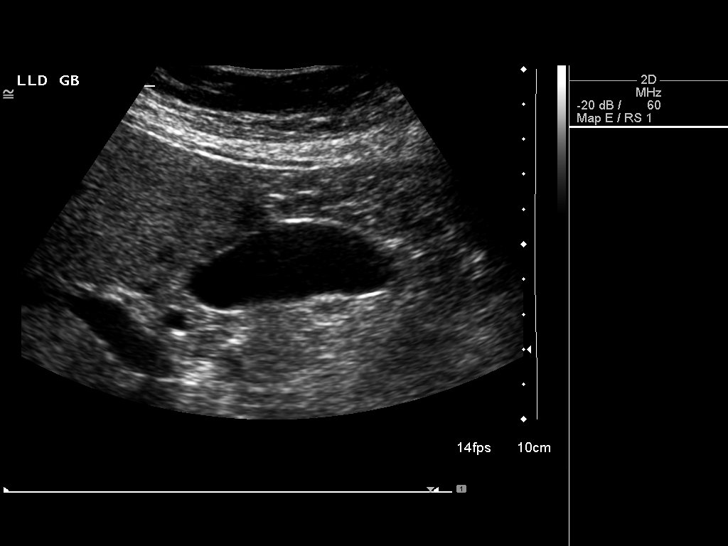
[im 35/42]
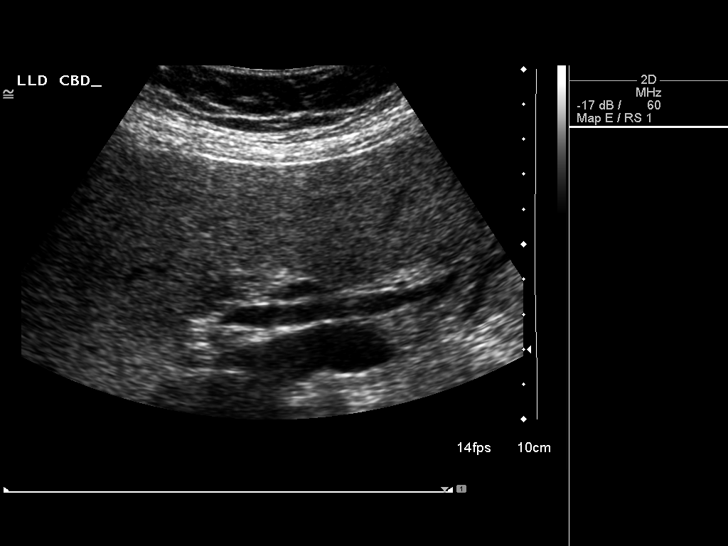
[im 38/42]
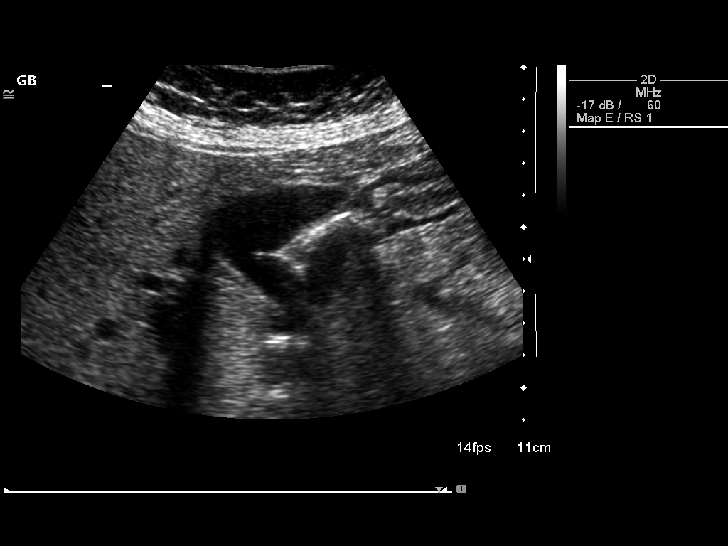
[im 42/42]
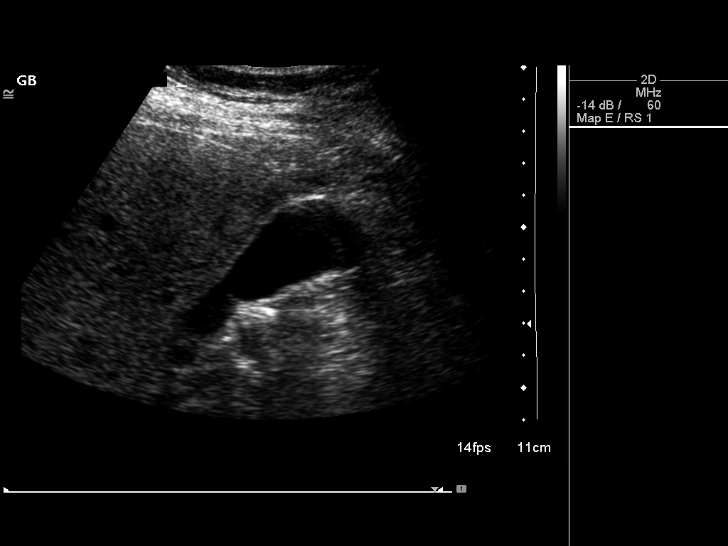

[14 of 25 positions shown; findings below may reference images not displayed]

FINDINGS: Gallbladder:

No gallstones or wall thickening visualized. There is no
pericholecystic fluid. No sonographic Murphy sign noted.

Common bile duct:

Diameter: 7 mm proximally which tapers to 4 mm distally. No mass or
calculus is seen in the biliary ductal system.

Liver:

No focal lesion identified.  Liver echogenicity is increased.
IMPRESSION: Increased liver echogenicity, probably indicative of hepatic
steatosis. While no focal liver lesions are identified, it must be
cautioned that the sensitivity of ultrasound for focal liver lesions
is diminished in this circumstance. Study otherwise unremarkable.

## 2014-11-13 ENCOUNTER — Encounter: Payer: Self-pay | Admitting: Cardiology

## 2014-11-25 ENCOUNTER — Ambulatory Visit (INDEPENDENT_AMBULATORY_CARE_PROVIDER_SITE_OTHER): Payer: Commercial Managed Care - HMO | Admitting: Internal Medicine

## 2014-11-25 ENCOUNTER — Encounter: Payer: Self-pay | Admitting: Internal Medicine

## 2014-11-25 VITALS — BP 102/78 | HR 82 | Temp 98.2°F | Ht 64.5 in | Wt 175.8 lb

## 2014-11-25 DIAGNOSIS — I10 Essential (primary) hypertension: Secondary | ICD-10-CM | POA: Diagnosis not present

## 2014-11-25 DIAGNOSIS — E1149 Type 2 diabetes mellitus with other diabetic neurological complication: Secondary | ICD-10-CM

## 2014-11-25 DIAGNOSIS — R21 Rash and other nonspecific skin eruption: Secondary | ICD-10-CM

## 2014-11-25 DIAGNOSIS — IMO0002 Reserved for concepts with insufficient information to code with codable children: Secondary | ICD-10-CM

## 2014-11-25 DIAGNOSIS — E1165 Type 2 diabetes mellitus with hyperglycemia: Principal | ICD-10-CM

## 2014-11-25 DIAGNOSIS — E1141 Type 2 diabetes mellitus with diabetic mononeuropathy: Secondary | ICD-10-CM | POA: Diagnosis not present

## 2014-11-25 DIAGNOSIS — E039 Hypothyroidism, unspecified: Secondary | ICD-10-CM

## 2014-11-25 NOTE — Progress Notes (Signed)
Subjective:    Patient ID: Dawn Huang, female    DOB: [birth year], 64 y.o.   MRN: [id number]  HPI  64 year old white female with history of uncontrolled type 2 diabetes, hypothyroidism and hypertension for routine follow-up.  Interval medical history-patient seen for possible sinusitis by Dr. Clent Ridges In April 2016. Patient reports her symptoms resolved with treatment with azithromycin. Within the past 4-6 weeks, patient complains of experiencing unexplained rash. She describes possible hives. No specific trigger.  Rash localized to axilla, under bilateral breasts and groin area.  Rash seems to be worse with sweating at night.  Hypertension - her previous Cr was slightly elevated.  She reports use ibuprofen occasionally.  DM II uncontrolled - in general her blood sugar reading much improved.  Usually 100-120 in AM.  Rare reading in the 60's.   Review of Systems Negative for chest pain,  Negative for paraesthesias    Past Medical History  Diagnosis Date  . Hyperlipidemia   . Hypertension   . Diabetes mellitus type 2, uncontrolled 2008  . Hypothyroidism   . Depression   . Nephrolithiasis     Social History   Social History  . Marital Status: Single    Spouse Name: N/A  . Number of Children: N/A  . Years of Education: N/A   Occupational History  . Not on file.   Social History Main Topics  . Smoking status: Never Smoker   . Smokeless tobacco: Never Used  . Alcohol Use: No  . Drug Use: No  . Sexual Activity: Not on file   Other Topics Concern  . Not on file   Social History Narrative   Patient is divorced, she was married twice in the past. She has a son age 28, daughter age 15 who lives in [place], daughter age 50 that lives in [place]   Patient moved to [place] area from [place] 2008    Past Surgical History  Procedure Laterality Date  . Tonsillectomy  1968    Family History  Problem Relation Age of Onset  . Coronary artery disease Father   . Coronary  artery disease Brother   . Breast cancer Daughter     No Known Allergies  Current Outpatient Prescriptions on File Prior to Visit  Medication Sig Dispense Refill  . amLODipine (NORVASC) 5 MG tablet Take 1 tablet (5 mg total) by mouth daily. 90 tablet 1  . atorvastatin (LIPITOR) 20 MG tablet Take 1 tablet by mouth  daily 90 tablet 1  . BAYER MICROLET LANCETS lancets 1 each by Other route 3 (three) times daily. Use as instructed 300 each 3  . fenofibrate 54 MG tablet Take 1 tablet by mouth  daily 90 tablet 1  . glucose blood (BAYER CONTOUR NEXT TEST) test strip 1 each by Other route 3 (three) times daily. Use as instructed 300 each 3  . Insulin Detemir (LEVEMIR) 100 UNIT/ML Pen Use 35 units at bedtime as directed 5 pen 5  . insulin lispro (HUMALOG) 100 UNIT/ML KiwkPen Use 10 to 15 units ten minutes before your evening meal 2 pen 0  . Insulin Pen Needle (AURORA PEN NEEDLES) 31G X 8 MM MISC Use once daily as directed 100 each 1  . levothyroxine (SYNTHROID, LEVOTHROID) 137 MCG tablet Take 1 tablet by mouth  daily before breakfast 90 tablet 1  . Liraglutide (VICTOZA) 18 MG/3ML SOPN Inject 1.2 mg into the skin daily. 18 mL 3  . metFORMIN (GLUCOPHAGE) 1000 MG tablet Take 1 tablet by mouth  twice a day with a meal 180 tablet 1  . metoprolol succinate (TOPROL-XL) 50 MG 24 hr tablet Take 1 tablet by mouth  daily 90 tablet 1  . sertraline (ZOLOFT) 100 MG tablet Take 1 tablet by mouth  daily 90 tablet 1  . valsartan-hydrochlorothiazide (DIOVAN-HCT) 160-25 MG per tablet Take 0.5 tablets by mouth daily. 45 tablet 1   No current facility-administered medications on file prior to visit.    BP 102/78 mmHg  Pulse 82  Temp(Src) 98.2 F (36.8 C) (Oral)  Ht 5' 4.5" (1.638 m)  Wt 175 lb 12.8 oz (79.742 kg)  BMI 29.72 kg/m2    Objective:   Physical Exam  Constitutional: She is oriented to person, place, and time. She appears well-developed and well-nourished. No distress.  HENT:  Head: Normocephalic  and atraumatic.  Eyes: EOM are normal. Pupils are equal, round, and reactive to light.  Neck: Neck supple.  Cardiovascular: Normal rate, regular rhythm and normal heart sounds.   No murmur heard. Pulmonary/Chest: Effort normal and breath sounds normal. She has no wheezes.  Neurological: She is alert and oriented to person, place, and time. No cranial nerve deficit.  Skin: Skin is warm and dry. No rash noted.  Psychiatric: She has a normal mood and affect. Her behavior is normal.        Assessment & Plan:   1. DM type II uncontrolled. 2. Hypertension 3. Rash 4. Hypothyroidism  Overall, patient's morning blood sugars are significantly improved with use of mealtime insulin. We discussed avoiding hypoglycemia. She will reduce her mealtime insulin by 2-4 units if she plans more than usual physical activity.  Patient's blood pressure stable. She was advised to monitor blood pressures at home. She understands goal blood pressure less than 130/80.  Patient describes unexplained rash. She thinks rash may be related to hives. Patient advised to take photo of affected skin next time she has recurrence.  Her symptoms may be intertrigo based up rash distribution.   Monitor thyroid function studies.

## 2014-11-25 NOTE — Patient Instructions (Signed)
Contact our office if you experience low blood sugar readings Decrease Humalog insulin dose by 2 to 4 units if you plan more than usual physical activity

## 2014-11-25 NOTE — Progress Notes (Signed)
Pre visit review using our clinic review tool, if applicable. No additional management support is needed unless otherwise documented below in the visit note. 

## 2014-11-26 LAB — BASIC METABOLIC PANEL
BUN: 24 mg/dL — ABNORMAL HIGH (ref 6–23)
CALCIUM: 10.1 mg/dL (ref 8.4–10.5)
CHLORIDE: 106 meq/L (ref 96–112)
CO2: 26 mEq/L (ref 19–32)
Creatinine, Ser: 1.5 mg/dL — ABNORMAL HIGH (ref 0.40–1.20)
GFR: 36.86 mL/min — AB (ref >60.00)
Glucose, Bld: 157 mg/dL — ABNORMAL HIGH (ref 70–99)
POTASSIUM: 5.1 meq/L (ref 3.5–5.1)
Sodium: 142 mEq/L (ref 135–145)

## 2014-11-26 LAB — TSH: TSH: 0.06 u[IU]/mL — AB (ref 0.35–4.50)

## 2014-11-26 LAB — HEMOGLOBIN A1C: Hgb A1c MFr Bld: 5.8 % (ref 4.6–6.5)

## 2014-11-27 ENCOUNTER — Other Ambulatory Visit: Payer: Self-pay | Admitting: Internal Medicine

## 2014-11-27 DIAGNOSIS — N289 Disorder of kidney and ureter, unspecified: Secondary | ICD-10-CM

## 2014-11-27 MED ORDER — LEVOTHYROXINE SODIUM 125 MCG PO TABS: 125.0000 ug | ORAL_TABLET | Freq: Every day | ORAL | Status: DC

## 2014-11-27 MED ORDER — VALSARTAN 80 MG PO TABS: 80.0000 mg | ORAL_TABLET | Freq: Every day | ORAL | Status: DC

## 2014-11-30 ENCOUNTER — Encounter: Payer: Self-pay | Admitting: Internal Medicine

## 2014-12-04 ENCOUNTER — Encounter: Payer: Self-pay | Admitting: Internal Medicine

## 2014-12-07 ENCOUNTER — Encounter: Payer: Self-pay | Admitting: Internal Medicine

## 2014-12-07 ENCOUNTER — Other Ambulatory Visit (INDEPENDENT_AMBULATORY_CARE_PROVIDER_SITE_OTHER): Payer: Commercial Managed Care - HMO

## 2014-12-07 DIAGNOSIS — N289 Disorder of kidney and ureter, unspecified: Secondary | ICD-10-CM | POA: Diagnosis not present

## 2014-12-07 LAB — BASIC METABOLIC PANEL
BUN: 23 mg/dL (ref 6–23)
CO2: 24 mEq/L (ref 19–32)
Calcium: 10.3 mg/dL (ref 8.4–10.5)
Chloride: 108 mEq/L (ref 96–112)
Creatinine, Ser: 1.29 mg/dL — ABNORMAL HIGH (ref 0.40–1.20)
GFR: 43.86 mL/min — ABNORMAL LOW (ref >60.00)
GLUCOSE: 197 mg/dL — AB (ref 70–99)
Potassium: 5.5 mEq/L — ABNORMAL HIGH (ref 3.5–5.1)
Sodium: 144 mEq/L (ref 135–145)

## 2014-12-09 ENCOUNTER — Other Ambulatory Visit: Payer: Self-pay | Admitting: Internal Medicine

## 2014-12-09 DIAGNOSIS — R899 Unspecified abnormal finding in specimens from other organs, systems and tissues: Secondary | ICD-10-CM

## 2014-12-11 ENCOUNTER — Ambulatory Visit: Payer: Commercial Managed Care - HMO | Admitting: Internal Medicine

## 2014-12-14 ENCOUNTER — Ambulatory Visit
Admission: RE | Admit: 2014-12-14 | Discharge: 2014-12-14 | Disposition: A | Discharge status: Home / Self Care | Payer: Commercial Managed Care - HMO | Source: Ambulatory Visit | Attending: Internal Medicine | Admitting: Internal Medicine

## 2014-12-14 DIAGNOSIS — N289 Disorder of kidney and ureter, unspecified: Secondary | ICD-10-CM

## 2015-01-06 ENCOUNTER — Ambulatory Visit: Payer: Commercial Managed Care - HMO | Admitting: Internal Medicine

## 2015-01-11 ENCOUNTER — Other Ambulatory Visit (INDEPENDENT_AMBULATORY_CARE_PROVIDER_SITE_OTHER): Payer: Commercial Managed Care - HMO

## 2015-01-11 DIAGNOSIS — R899 Unspecified abnormal finding in specimens from other organs, systems and tissues: Secondary | ICD-10-CM | POA: Diagnosis not present

## 2015-01-11 LAB — BASIC METABOLIC PANEL
BUN: 25 mg/dL — ABNORMAL HIGH (ref 6–23)
CHLORIDE: 107 meq/L (ref 96–112)
CO2: 21 mEq/L (ref 19–32)
CREATININE: 1.05 mg/dL (ref 0.40–1.20)
Calcium: 9.4 mg/dL (ref 8.4–10.5)
GFR: 55.61 mL/min — ABNORMAL LOW (ref >60.00)
Glucose, Bld: 180 mg/dL — ABNORMAL HIGH (ref 70–99)
Potassium: 3.8 mEq/L (ref 3.5–5.1)
SODIUM: 140 meq/L (ref 135–145)

## 2015-02-13 ENCOUNTER — Other Ambulatory Visit: Payer: Self-pay | Admitting: Internal Medicine

## 2015-03-06 ENCOUNTER — Other Ambulatory Visit: Payer: Self-pay | Admitting: Internal Medicine

## 2015-03-13 ENCOUNTER — Other Ambulatory Visit: Payer: Self-pay | Admitting: Internal Medicine

## 2015-03-17 ENCOUNTER — Ambulatory Visit (INDEPENDENT_AMBULATORY_CARE_PROVIDER_SITE_OTHER): Payer: Commercial Managed Care - HMO | Admitting: Family Medicine

## 2015-03-17 ENCOUNTER — Encounter: Payer: Self-pay | Admitting: Family Medicine

## 2015-03-17 VITALS — BP 147/87 | HR 81 | Temp 98.2°F | Ht 64.5 in | Wt 174.0 lb

## 2015-03-17 DIAGNOSIS — I10 Essential (primary) hypertension: Secondary | ICD-10-CM | POA: Diagnosis not present

## 2015-03-17 DIAGNOSIS — E1149 Type 2 diabetes mellitus with other diabetic neurological complication: Secondary | ICD-10-CM

## 2015-03-17 DIAGNOSIS — E1165 Type 2 diabetes mellitus with hyperglycemia: Secondary | ICD-10-CM

## 2015-03-17 DIAGNOSIS — J019 Acute sinusitis, unspecified: Secondary | ICD-10-CM

## 2015-03-17 DIAGNOSIS — E039 Hypothyroidism, unspecified: Secondary | ICD-10-CM | POA: Diagnosis not present

## 2015-03-17 DIAGNOSIS — IMO0002 Reserved for concepts with insufficient information to code with codable children: Secondary | ICD-10-CM

## 2015-03-17 LAB — TSH: TSH: 0.03 u[IU]/mL — ABNORMAL LOW (ref 0.35–4.50)

## 2015-03-17 LAB — HEMOGLOBIN A1C: Hgb A1c MFr Bld: 6.7 % — ABNORMAL HIGH (ref 4.6–6.5)

## 2015-03-17 MED ORDER — AZITHROMYCIN 250 MG PO TABS: ORAL_TABLET | ORAL | Status: DC

## 2015-03-17 NOTE — Progress Notes (Signed)
Pre visit review using our clinic review tool, if applicable. No additional management support is needed unless otherwise documented below in the visit note. 

## 2015-03-17 NOTE — Progress Notes (Signed)
   Subjective:    Patient ID: Dawn Huang, female    DOB: 11/11/1950, 64 y.o.   MRN: [id number]  HPI Here for 3 weeks of sinus pressure, PND, and coughing up yellow sputum. No fever. Also she has felt very tired for the past month or so, and she asks to have her thyroid level checked. I see the dose of her Synthroid was lowered 3 months ago. Her weight is stable.    Review of Systems  Constitutional: Positive for fatigue. Negative for activity change, appetite change and unexpected weight change.  HENT: Positive for congestion, postnasal drip and sinus pressure. Negative for sore throat.   Eyes: Negative.   Respiratory: Positive for cough.        Objective:   Physical Exam  Constitutional: She is oriented to person, place, and time. She appears well-developed and well-nourished.  HENT:  Right Ear: External ear normal.  Left Ear: External ear normal.  Nose: Nose normal.  Mouth/Throat: Oropharynx is clear and moist.  Eyes: Conjunctivae are normal.  Neck: No thyromegaly present.  Cardiovascular: Normal rate, regular rhythm, normal heart sounds and intact distal pulses.   Pulmonary/Chest: Effort normal and breath sounds normal.  Lymphadenopathy:    She has no cervical adenopathy.  Neurological: She is alert and oriented to person, place, and time.          Assessment & Plan:  Treat the sinusitis with a Zpack and add Mucinex. Her HTN is stable. We will check an A1c and a TSH today.

## 2015-03-23 ENCOUNTER — Encounter: Payer: Self-pay | Admitting: Family Medicine

## 2015-03-23 ENCOUNTER — Other Ambulatory Visit: Payer: Self-pay | Admitting: Internal Medicine

## 2015-03-24 ENCOUNTER — Other Ambulatory Visit: Payer: Self-pay | Admitting: Internal Medicine

## 2015-03-24 MED ORDER — AMOXICILLIN-POT CLAVULANATE 875-125 MG PO TABS: 1.0000 | ORAL_TABLET | Freq: Two times a day (BID) | ORAL | Status: DC

## 2015-03-24 NOTE — Telephone Encounter (Signed)
I sent script e-scribe and spoke with pt. 

## 2015-03-24 NOTE — Telephone Encounter (Signed)
Call in Augmentin [phone] bid for 10 days  

## 2015-03-25 MED ORDER — LEVOTHYROXINE SODIUM 100 MCG PO TABS: 100.0000 ug | ORAL_TABLET | Freq: Every day | ORAL | Status: DC

## 2015-03-25 MED ORDER — INSULIN DETEMIR 100 UNIT/ML FLEXPEN: PEN_INJECTOR | SUBCUTANEOUS | Status: DC

## 2015-03-25 NOTE — Addendum Note (Signed)
Addended by: Aniceto BossNIMMONS, Bellatrix Devonshire A on: 03/25/2015 09:27 AM   Modules accepted: Orders

## 2015-05-14 ENCOUNTER — Other Ambulatory Visit: Payer: Self-pay | Admitting: Internal Medicine

## 2015-05-27 ENCOUNTER — Encounter: Payer: Self-pay | Admitting: Internal Medicine

## 2015-05-28 ENCOUNTER — Other Ambulatory Visit: Payer: Self-pay | Admitting: Internal Medicine

## 2015-05-28 DIAGNOSIS — E039 Hypothyroidism, unspecified: Secondary | ICD-10-CM

## 2015-05-28 DIAGNOSIS — E1165 Type 2 diabetes mellitus with hyperglycemia: Secondary | ICD-10-CM

## 2015-05-28 DIAGNOSIS — E118 Type 2 diabetes mellitus with unspecified complications: Principal | ICD-10-CM

## 2015-05-28 NOTE — Telephone Encounter (Signed)
Pt has been scheduled. thanks

## 2015-05-28 NOTE — Telephone Encounter (Signed)
I states on AVS Dr Artist Pais placed order for BMET, A1C and TSH Is that ok?

## 2015-06-08 ENCOUNTER — Ambulatory Visit (INDEPENDENT_AMBULATORY_CARE_PROVIDER_SITE_OTHER): Payer: Commercial Managed Care - HMO | Admitting: Family Medicine

## 2015-06-08 ENCOUNTER — Encounter: Payer: Self-pay | Admitting: Family Medicine

## 2015-06-08 VITALS — BP 130/82 | HR 64 | Temp 97.8°F | Ht 64.5 in | Wt 182.0 lb

## 2015-06-08 DIAGNOSIS — E039 Hypothyroidism, unspecified: Secondary | ICD-10-CM | POA: Diagnosis not present

## 2015-06-08 DIAGNOSIS — E1165 Type 2 diabetes mellitus with hyperglycemia: Secondary | ICD-10-CM | POA: Diagnosis not present

## 2015-06-08 DIAGNOSIS — E1149 Type 2 diabetes mellitus with other diabetic neurological complication: Secondary | ICD-10-CM | POA: Diagnosis not present

## 2015-06-08 DIAGNOSIS — I10 Essential (primary) hypertension: Secondary | ICD-10-CM | POA: Diagnosis not present

## 2015-06-08 DIAGNOSIS — R5382 Chronic fatigue, unspecified: Secondary | ICD-10-CM | POA: Diagnosis not present

## 2015-06-08 DIAGNOSIS — IMO0002 Reserved for concepts with insufficient information to code with codable children: Secondary | ICD-10-CM

## 2015-06-08 LAB — HEPATIC FUNCTION PANEL
ALBUMIN: 4.9 g/dL (ref 3.5–5.2)
ALK PHOS: 62 U/L (ref 39–117)
ALT: 15 U/L (ref 0–35)
AST: 17 U/L (ref 0–37)
Bilirubin, Direct: 0.2 mg/dL (ref 0.0–0.3)
Total Bilirubin: 1.1 mg/dL (ref 0.2–1.2)
Total Protein: 7.8 g/dL (ref 6.0–8.3)

## 2015-06-08 LAB — CBC WITH DIFFERENTIAL/PLATELET
BASOS ABS: 0.1 10*3/uL (ref 0.0–0.1)
Basophils Relative: 0.7 % (ref 0.0–3.0)
EOS PCT: 3.6 % (ref 0.0–5.0)
Eosinophils Absolute: 0.3 10*3/uL (ref 0.0–0.7)
HEMATOCRIT: 38 % (ref 36.0–46.0)
HEMOGLOBIN: 13 g/dL (ref 12.0–15.0)
LYMPHS ABS: 1.9 10*3/uL (ref 0.7–4.0)
LYMPHS PCT: 23.8 % (ref 12.0–46.0)
MCHC: 34.3 g/dL (ref 30.0–36.0)
MCV: 86.8 fl (ref 78.0–100.0)
Monocytes Absolute: 0.7 10*3/uL (ref 0.1–1.0)
Monocytes Relative: 8.7 % (ref 3.0–12.0)
NEUTROS PCT: 63.2 % (ref 43.0–77.0)
Neutro Abs: 5 10*3/uL (ref 1.4–7.7)
Platelets: 195 10*3/uL (ref 150.0–400.0)
RBC: 4.38 Mil/uL (ref 3.87–5.11)
RDW: 15.2 % (ref 11.5–15.5)
WBC: 7.9 10*3/uL (ref 4.0–10.5)

## 2015-06-08 LAB — BASIC METABOLIC PANEL
BUN: 32 mg/dL — ABNORMAL HIGH (ref 6–23)
CALCIUM: 10.2 mg/dL (ref 8.4–10.5)
CO2: 27 mEq/L (ref 19–32)
Chloride: 102 mEq/L (ref 96–112)
Creatinine, Ser: 1.43 mg/dL — ABNORMAL HIGH (ref 0.40–1.20)
GFR: 38.89 mL/min — AB (ref >60.00)
Glucose, Bld: 162 mg/dL — ABNORMAL HIGH (ref 70–99)
POTASSIUM: 5 meq/L (ref 3.5–5.1)
SODIUM: 139 meq/L (ref 135–145)

## 2015-06-08 LAB — T4, FREE: FREE T4: 0.79 ng/dL (ref 0.60–1.60)

## 2015-06-08 LAB — TSH: TSH: 3.6 u[IU]/mL (ref 0.35–4.50)

## 2015-06-08 LAB — T3, FREE: T3 FREE: 2.9 pg/mL (ref 2.3–4.2)

## 2015-06-08 MED ORDER — INSULIN LISPRO 100 UNIT/ML (KWIKPEN): PEN_INJECTOR | SUBCUTANEOUS | Status: DC

## 2015-06-08 MED ORDER — INSULIN PEN NEEDLE 31G X 8 MM MISC: Status: AC

## 2015-06-08 MED ORDER — METFORMIN HCL 1000 MG PO TABS: ORAL_TABLET | ORAL | Status: DC

## 2015-06-08 MED ORDER — INSULIN DETEMIR 100 UNIT/ML FLEXPEN: PEN_INJECTOR | SUBCUTANEOUS | Status: DC

## 2015-06-08 NOTE — Progress Notes (Signed)
Pre visit review using our clinic review tool, if applicable. No additional management support is needed unless otherwise documented below in the visit note. 

## 2015-06-08 NOTE — Progress Notes (Signed)
   Subjective:    Patient ID: Genisis Sonnier, female    DOB: 06-Jul-1950, 65 y.o.   MRN: [id number]  HPI Here to follow up on issues and to complain of fatigue. I see from her chart that she has complained of feeling tired for years. Her BP has been stable. Her diabetes was well controlled last winter, and her A1c was excellent at 6.7 in November. However she has beem getting glucoses in the 200s and 300s the past few weeks. For some reason she was under the impression that Dr. Artist Pais wanted her to stop her Metformin, so she did stop taking this about 2 months ago. Finally we lowered her dose of Synthroid 3 months ago and this needs to be rechecked. Her weight is stable.    Review of Systems  Constitutional: Positive for fatigue.  Respiratory: Negative.   Cardiovascular: Negative.   Gastrointestinal: Negative.   Endocrine: Negative.   Genitourinary: Negative.   Neurological: Negative.        Objective:   Physical Exam  Constitutional: She is oriented to person, place, and time. She appears well-developed and well-nourished.  Neck: No thyromegaly present.  Cardiovascular: Normal rate, regular rhythm, normal heart sounds and intact distal pulses.   Pulmonary/Chest: Effort normal and breath sounds normal.  Abdominal: Soft. Bowel sounds are normal. She exhibits no distension and no mass. There is no tenderness. There is no rebound and no guarding.  Musculoskeletal: She exhibits no edema.  Lymphadenopathy:    She has no cervical adenopathy.  Neurological: She is alert and oriented to person, place, and time.          Assessment & Plan:  Chronic fatigue, probably multifactorial. Her diabetes has been poorly controlled and I think most of this is due to her stopping the Metformin. I asked her to get back on it. We will check an A1c next time. Her BP is stable. We will check labs including a full thyroid panel.

## 2015-06-09 ENCOUNTER — Encounter: Payer: Self-pay | Admitting: Family Medicine

## 2015-06-11 ENCOUNTER — Other Ambulatory Visit: Payer: Self-pay | Admitting: Internal Medicine

## 2015-06-11 ENCOUNTER — Other Ambulatory Visit: Payer: Commercial Managed Care - HMO

## 2015-07-28 ENCOUNTER — Encounter: Payer: Self-pay | Admitting: Family Medicine

## 2015-07-28 ENCOUNTER — Ambulatory Visit (INDEPENDENT_AMBULATORY_CARE_PROVIDER_SITE_OTHER): Payer: Commercial Managed Care - HMO | Admitting: Family Medicine

## 2015-07-28 VITALS — BP 130/80 | HR 76 | Temp 98.1°F | Resp 12 | Ht 64.17 in | Wt 181.9 lb

## 2015-07-28 DIAGNOSIS — N183 Chronic kidney disease, stage 3 unspecified: Secondary | ICD-10-CM

## 2015-07-28 DIAGNOSIS — F329 Major depressive disorder, single episode, unspecified: Secondary | ICD-10-CM

## 2015-07-28 DIAGNOSIS — I10 Essential (primary) hypertension: Secondary | ICD-10-CM | POA: Diagnosis not present

## 2015-07-28 DIAGNOSIS — Z794 Long term (current) use of insulin: Secondary | ICD-10-CM | POA: Diagnosis not present

## 2015-07-28 DIAGNOSIS — E049 Nontoxic goiter, unspecified: Secondary | ICD-10-CM

## 2015-07-28 DIAGNOSIS — F32A Depression, unspecified: Secondary | ICD-10-CM

## 2015-07-28 DIAGNOSIS — K219 Gastro-esophageal reflux disease without esophagitis: Secondary | ICD-10-CM | POA: Insufficient documentation

## 2015-07-28 DIAGNOSIS — E785 Hyperlipidemia, unspecified: Secondary | ICD-10-CM | POA: Diagnosis not present

## 2015-07-28 DIAGNOSIS — E1122 Type 2 diabetes mellitus with diabetic chronic kidney disease: Secondary | ICD-10-CM | POA: Diagnosis not present

## 2015-07-28 LAB — MICROALBUMIN / CREATININE URINE RATIO
Creatinine,U: 139.9 mg/dL
MICROALB/CREAT RATIO: 0.6 mg/g (ref 0.0–30.0)
Microalb, Ur: 0.8 mg/dL (ref 0.0–1.9)

## 2015-07-28 LAB — BASIC METABOLIC PANEL
BUN: 26 mg/dL — ABNORMAL HIGH (ref 6–23)
CO2: 24 meq/L (ref 19–32)
Calcium: 9.7 mg/dL (ref 8.4–10.5)
Chloride: 105 mEq/L (ref 96–112)
Creatinine, Ser: 1.32 mg/dL — ABNORMAL HIGH (ref 0.40–1.20)
GFR: 42.63 mL/min — ABNORMAL LOW (ref >60.00)
GLUCOSE: 125 mg/dL — AB (ref 70–99)
POTASSIUM: 3.9 meq/L (ref 3.5–5.1)
SODIUM: 139 meq/L (ref 135–145)

## 2015-07-28 LAB — HEMOGLOBIN A1C: Hgb A1c MFr Bld: 6.8 % — ABNORMAL HIGH (ref 4.6–6.5)

## 2015-07-28 LAB — LIPID PANEL
CHOL/HDL RATIO: 3
CHOLESTEROL: 148 mg/dL (ref 0–200)
HDL: 55 mg/dL (ref >39.00)
LDL CALC: 67 mg/dL (ref 0–99)
NonHDL: 92.57
TRIGLYCERIDES: 130 mg/dL (ref 0.0–149.0)
VLDL: 26 mg/dL (ref 0.0–40.0)

## 2015-07-28 LAB — VITAMIN D 25 HYDROXY (VIT D DEFICIENCY, FRACTURES): VITD: 19.4 ng/mL — ABNORMAL LOW (ref 30.00–100.00)

## 2015-07-28 MED ORDER — SERTRALINE HCL 100 MG PO TABS: ORAL_TABLET | ORAL | Status: DC

## 2015-07-28 MED ORDER — INSULIN LISPRO 100 UNIT/ML (KWIKPEN): PEN_INJECTOR | SUBCUTANEOUS | Status: DC

## 2015-07-28 MED ORDER — ATORVASTATIN CALCIUM 20 MG PO TABS: ORAL_TABLET | ORAL | Status: DC

## 2015-07-28 MED ORDER — AMLODIPINE BESYLATE 5 MG PO TABS: ORAL_TABLET | ORAL | Status: DC

## 2015-07-28 MED ORDER — INSULIN DETEMIR 100 UNIT/ML FLEXPEN: PEN_INJECTOR | SUBCUTANEOUS | Status: DC

## 2015-07-28 MED ORDER — METOPROLOL SUCCINATE ER 50 MG PO TB24: ORAL_TABLET | ORAL | Status: DC

## 2015-07-28 MED ORDER — LIRAGLUTIDE 18 MG/3ML ~~LOC~~ SOPN: PEN_INJECTOR | SUBCUTANEOUS | Status: DC

## 2015-07-28 MED ORDER — OMEPRAZOLE 40 MG PO CPDR: 40.0000 mg | DELAYED_RELEASE_CAPSULE | Freq: Every day | ORAL | Status: DC

## 2015-07-28 MED ORDER — VALSARTAN-HYDROCHLOROTHIAZIDE 160-25 MG PO TABS: ORAL_TABLET | ORAL | Status: DC

## 2015-07-28 MED ORDER — METFORMIN HCL 1000 MG PO TABS: ORAL_TABLET | ORAL | Status: DC

## 2015-07-28 NOTE — Progress Notes (Signed)
Subjective:    Patient ID: Dawn Huang, female    DOB: 04/01/1951, 65 y.o.   MRN: [id number]  HPI  [name] is a 65 y.o.female here today to establish care with me and to follow on some of her chronic medical problems. She is former pt of Dr Artist PaisYoo. She has hx of hypothyroidism,DM II, HTN,HLD among some.  -Diabetes Mellitus II: She is currently on Levemir 35 U daily, Humalog 12 U am and 15 U pm, Victoza 1.8 mg daily, and Metformin 1000 mg bid. Problem has been going on for years and overall stable. She is checking BS's, denies any hypoglycemia, BS's 130-176. She is tolerating medications well. She denies abdominal pain, nausea, vomiting, polydipsia, polyuria, or polyphagia. 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    Lab Results  Component Value Date   CREATININE 1.43* 06/08/2015   BUN 32* 06/08/2015   NA 139 06/08/2015   K 5.0 06/08/2015   CL 102 06/08/2015   CO2 27 06/08/2015    Lab Results  Component Value Date   HGBA1C 6.7* 03/17/2015   Lab Results  Component Value Date   MICROALBUR 1.0 07/22/2014   E GFR has been under normal range, no gross hematuria, decreased urine output, or foam in urine.  She states that she eats whatever she wants to.  Walks daily for 15 minutes. She has a sedentary job, [employer].  -Hypertension: Currently She is on Amlodipine 5 mg,Metoprolol Succinate 50 mg daily,and Valsartan-HCTZ 160-25 mg 1/2 tab daily. She is taking medications as instructed, no side effects reported.  She has not noted unusual headache, visual changes, exertional chest pain, dyspnea,  dyspnea, focal weakness, or edema. She is not checking BP lately at home.  -A lot of heartburn, at night is worse, throat burning sensation and reflux. Long time, she took "something" long time ago. It happens at any time even when she is not eating. No abdominal pain or changes in bowel habits. No blood in stool  or melena. She has not had colonoscopy and not interested in doing so.  She has tried baking soda, helps.  - HLD: Currently she is on fenofibrate 54 mg daily and atorvastatin 20 mg daily. She is not following a low fat diet, she is tolerating medication well with no reported side effects.  Lab Results  Component Value Date   CHOL 148 07/28/2015   HDL 55.00 07/28/2015   LDLCALC 67 07/28/2015   LDLDIRECT 100.5 09/16/2012   TRIG 130.0 07/28/2015   CHOLHDL 3 07/28/2015   Depression: Currently she is on sertraline 100 mg daily, which she has taken for many years. She denies any depressed symptoms, suicidal thoughts, or side effects from the medication. She lives with some and his fiance, she is going through some stress at home, she feels like she is dealing with with situation.   Review of Systems  Constitutional: Negative for fever, activity change, appetite change, fatigue and unexpected weight change.  HENT: Negative for mouth sores, nosebleeds and trouble swallowing.   Eyes: Negative for redness and visual disturbance.  Respiratory: Negative for cough, shortness of breath and wheezing.   Cardiovascular: Negative for chest pain, palpitations and leg swelling.  Gastrointestinal: Negative for nausea, vomiting and abdominal pain.       Negative for changes in bowel habits.  Endocrine: Negative for cold intolerance, heat intolerance, polydipsia, polyphagia and polyuria.  Genitourinary: Negative for dysuria, hematuria, decreased urine  volume and difficulty urinating.  Musculoskeletal: Negative for myalgias and gait problem.  Neurological: Negative for dizziness, seizures, syncope, weakness, numbness and headaches.  Psychiatric/Behavioral: Negative.  Negative for suicidal ideas and hallucinations. The patient is not nervous/anxious.        Depression controlled.     Current Outpatient Prescriptions on File Prior to Visit  Medication Sig Dispense Refill  . amLODipine (NORVASC) 5 MG  tablet Take 1 tablet by mouth  daily 90 tablet 1  . atorvastatin (LIPITOR) 20 MG tablet Take 1 tablet by mouth  daily 90 tablet 1  . BAYER MICROLET LANCETS lancets 1 each by Other route 3 (three) times daily. Use as instructed 300 each 3  . fenofibrate 54 MG tablet Take 1 tablet by mouth  daily 90 tablet 0  . glucose blood (BAYER CONTOUR NEXT TEST) test strip 1 each by Other route 3 (three) times daily. Use as instructed 300 each 3  . Insulin Detemir (LEVEMIR) 100 UNIT/ML Pen Use 35 units at bedtime as directed 5 pen 11  . insulin lispro (HUMALOG) 100 UNIT/ML KiwkPen Use 10 to 15 units ten minutes before your evening meal 2 pen 11  . Insulin Pen Needle (AURORA PEN NEEDLES) 31G X 8 MM MISC Use once daily as directed 100 each 5  . levothyroxine (SYNTHROID, LEVOTHROID) 100 MCG tablet Take 1 tablet (100 mcg total) by mouth daily. 90 tablet 3  . Liraglutide (VICTOZA) 18 MG/3ML SOPN Inject 1.2 mg into the skin daily. 18 mL 3  . metFORMIN (GLUCOPHAGE) 1000 MG tablet Take 1 tablet by mouth  twice a day with meals 180 tablet 3  . metoprolol succinate (TOPROL-XL) 50 MG 24 hr tablet Take 1 tablet by mouth  daily 90 tablet 1  . sertraline (ZOLOFT) 100 MG tablet Take 1 tablet by mouth  daily 90 tablet 0  . valsartan-hydrochlorothiazide (DIOVAN-HCT) 160-25 MG tablet Take one-half tablet by  mouth daily 45 tablet 3   No current facility-administered medications on file prior to visit.     Past Medical History  Diagnosis Date  . Hyperlipidemia   . Hypertension   . Diabetes mellitus type 2, uncontrolled (HCC) 2008  . Hypothyroidism   . Depression   . Nephrolithiasis     Social History   Social History  . Marital Status: Single    Spouse Name: N/A  . Number of Children: N/A  . Years of Education: N/A   Social History Main Topics  . Smoking status: Never Smoker   . Smokeless tobacco: Never Used  . Alcohol Use: No  . Drug Use: No  . Sexual Activity: Not Asked   Other Topics Concern  . None     Social History Narrative   Patient is divorced, she was married twice in the past. She has a son age 38, daughter age 71 who lives in [place], daughter age 22 that lives in [place]   Patient moved to [place] area from [place] 2008    Filed Vitals:   07/28/15 0852  BP: 130/80  Pulse: 76  Temp: 98.1 F (36.7 C)  Resp: 12   Body mass index is 31.05 kg/(m^2).      Objective:   Physical Exam  Constitutional: She is oriented to person, place, and time. She appears well-developed. No distress.  HENT:  Head: Atraumatic.  Mouth/Throat: Uvula is midline and oropharynx is clear and moist. No oral lesions.  Eyes: Conjunctivae and EOM are normal. Pupils are equal, round, and reactive to light.  Neck: Full passive range of motion without pain. Thyromegaly (? right nodule.) present.  ? Right thyroid nodule.  Cardiovascular: Normal rate, regular rhythm and normal heart sounds.   No murmur heard. Pulses:      Dorsalis pedis pulses are 2+ on the right side, and 2+ on the left side.       Posterior tibial pulses are 2+ on the right side, and 2+ on the left side.  Pulmonary/Chest: Effort normal and breath sounds normal. She has no wheezes. She has no rales. She exhibits no tenderness.  Abdominal: Soft. She exhibits no mass. There is no tenderness.  Musculoskeletal: She exhibits no edema.  Lymphadenopathy:    She has no cervical adenopathy.  Neurological: She is alert and oriented to person, place, and time. She has normal strength. No cranial nerve deficit or sensory deficit. Coordination and gait normal.  Foot exam: Normal monofilament, bunion L>R, no skin ulcers, normal pulses.  Skin: Skin is warm. No rash noted.  Psychiatric: She has a normal mood and affect. Her speech is normal.  Well groomed, good eye contact.       Assessment & Plan:    Moniqua was seen today for establish care.  Diagnoses and all orders for this visit:  Controlled type 2 diabetes mellitus with stage 3  chronic kidney disease, with long-term current use of insulin (HCC) No changes in current management, meds will be adjusted according to lab results, HgA1C pending. Foot care discussed. Periodic eye exam and dental care. We discussed possible complications of poorly controlled DM, healthy diet and regular physical activity will help. F/U in 4-6 months.  -     Basic metabolic panel -     Hemoglobin A1c -     Microalbumin/Creatinine Ratio, Urine  Essential hypertension Adequately controlled. No changes in current management. DASH diet recommended. Eye exam recommended annually. F/U in 6 months.  Hyperlipidemia  No changes in current management, will follow lab result and adjust management accordingly.  -     Lipid Panel  Chronic kidney disease (CKD), stage III (moderate) Low salt diet and adequate hydration. Avoid  NSAIDs.  Adequate controlled of HTN and DM. Continue ARB.   -     Vitamin D, 25-hydroxy  Enlarged thyroid gland Question of right thyroid nodule. TSH has been stable.  -     US Soft Tissue Head/Neck; Future  Depression Well controlled. No changes for now but at some point we could try to decrease dose.  Gastroesophageal reflux disease without esophagitis GERD precautions discussed. PPI side effects reviewed, she can try Omeprazole and continue for 3 months. F/U in 3-4 months.  -     omeprazole (PRILOSEC) 40 MG capsule; Take 1 capsule (40 mg total) by mouth daily before breakfast.   In regard to health maintenance she refused colonoscopy.  Lab Results  Component Value Date   HGBA1C 6.8* 07/28/2015    Lab Results  Component Value Date   CREATININE 1.32* 07/28/2015   BUN 26* 07/28/2015   NA 139 07/28/2015   K 3.9 07/28/2015   CL 105 07/28/2015   CO2 24 07/28/2015   CREATININE: 1.32 mg/dL ABNORMAL (95/62/13 0865) Estimated creatinine clearance - 43.2 mL/min   Lab Results  Component Value Date   MICROALBUR 0.8 07/28/2015   Lab Results    Component Value Date   CHOL 148 07/28/2015   HDL 55.00 07/28/2015   LDLCALC 67 07/28/2015   LDLDIRECT 100.5 09/16/2012   TRIG 130.0 07/28/2015   CHOLHDL  3 07/28/2015     Betty G. Swazil[place], MD  [facility]. [place] office.

## 2015-07-28 NOTE — Patient Instructions (Signed)
HgA1C goal < 7.0. Avoid sugar added food. Mediterranean diet has showed benefits for sugar control. Fasting blood sugar ideally 130 or less, 2 hours after meals less than 180.  No changes today but may adjust cholesterol and diabetes med depending of labs.

## 2015-07-28 NOTE — Progress Notes (Signed)
Pre visit review using our clinic review tool, if applicable. No additional management support is needed unless otherwise documented below in the visit note. 

## 2015-07-29 ENCOUNTER — Encounter: Payer: Self-pay | Admitting: *Deleted

## 2015-08-02 ENCOUNTER — Encounter: Payer: Self-pay | Admitting: Family Medicine

## 2015-08-02 ENCOUNTER — Other Ambulatory Visit: Payer: Self-pay | Admitting: Family Medicine

## 2015-08-02 DIAGNOSIS — E559 Vitamin D deficiency, unspecified: Secondary | ICD-10-CM

## 2015-08-02 DIAGNOSIS — L209 Atopic dermatitis, unspecified: Secondary | ICD-10-CM

## 2015-08-02 DIAGNOSIS — Z1211 Encounter for screening for malignant neoplasm of colon: Secondary | ICD-10-CM

## 2015-08-02 MED ORDER — TRIAMCINOLONE ACETONIDE 0.1 % EX CREA: 1.0000 "application " | TOPICAL_CREAM | Freq: Two times a day (BID) | CUTANEOUS | Status: DC | PRN

## 2015-08-02 MED ORDER — VITAMIN D (ERGOCALCIFEROL) 1.25 MG (50000 UNIT) PO CAPS: ORAL_CAPSULE | ORAL | Status: DC

## 2015-08-03 ENCOUNTER — Other Ambulatory Visit: Payer: Self-pay | Admitting: Internal Medicine

## 2015-08-03 DIAGNOSIS — R944 Abnormal results of kidney function studies: Secondary | ICD-10-CM

## 2015-08-03 MED ORDER — VALSARTAN 80 MG PO TABS: 80.0000 mg | ORAL_TABLET | Freq: Every day | ORAL | Status: DC

## 2015-08-05 ENCOUNTER — Other Ambulatory Visit: Payer: Commercial Managed Care - HMO

## 2015-09-03 ENCOUNTER — Encounter: Payer: Commercial Managed Care - HMO | Admitting: Gastroenterology

## 2015-09-08 ENCOUNTER — Encounter: Payer: Self-pay | Admitting: Family Medicine

## 2015-09-20 ENCOUNTER — Other Ambulatory Visit: Payer: Self-pay | Admitting: Family Medicine

## 2015-09-23 ENCOUNTER — Telehealth: Payer: Self-pay | Admitting: Internal Medicine

## 2015-09-23 NOTE — Telephone Encounter (Signed)
Spoke to Gholsonory who advised the pt be seen in ED.  Called and spoke to the pt.  She states she is [facility].  [facility].

## 2015-09-23 NOTE — Telephone Encounter (Signed)
Have not seen that the pt has [facility].  Tried calling her.  Received voicemail but unable to leave a message because mailbox if full.  Will try again at a later time.

## 2015-09-23 NOTE — Telephone Encounter (Signed)
Patient Name: Dawn Huang People DOB: 02/17/1951 Initial Comment Caller states just fell outside on bottom and now her back is bothering her. Nurse Assessment Nurse: Charna Elizabethrumbull, RN, Cathy Date/Time (Eastern Time): 09/23/2015 11:51:50 AM Confirm and document reason for call. If symptomatic, describe symptoms. You must click the next button to save text entered. ---Caller states she fell onto her tailbone this morning and she continues to have low/middle back pain (rated as a 9 on the 1 to 10 scale). No cuts to the skin. Alert and responsive. Has the patient traveled out of the country within the last 30 days? ---No Does the patient have any new or worsening symptoms? ---Yes Will a triage be completed? ---Yes Related visit to physician within the last 2 weeks? ---No Does the PT have any chronic conditions? (i.e. diabetes, asthma, etc.) ---Yes List chronic conditions. ---Thyroid problems, Diabetes, high Blood Pressure and Cholesterol Is this a behavioral health or substance abuse call? ---No Guidelines Guideline Title Affirmed Question Affirmed Notes Back Injury [1] Urinary or bowel incontinence (i.e., loss of bladder or bowel control) AND [2] new onset Final Disposition User Go to ED Now Charna Elizabethrumbull, RN, Cathy Referrals [facility] - ED Disagree/Comply: Comply

## 2015-09-24 NOTE — Telephone Encounter (Signed)
Spoke to the pt.  [facility].  Was [facility].  Diagnosed with a compression fracture.  Currently trying to get an appointment with Dr. Petra KubaKilpatrick (neuro).  Pt was given a small amount of pain medication from the ED.  Would like to know if she could have another small amount if needed until sees Dr. Petra KubaKilpatrick.  Advised the pt that Dr. SwazilandJordan is not in the office today but will send a message for her to see when she returns.

## 2015-09-27 NOTE — Telephone Encounter (Signed)
I would like for her to be seen for ER f/u and we can discuss refills if needed.  Thanks, BJ

## 2015-09-27 NOTE — Telephone Encounter (Signed)
Please schedule pt to see Dr. SwazilandJordan for hospital follow up.  Can discuss refill of pain medication if needed at that time.

## 2015-10-01 ENCOUNTER — Other Ambulatory Visit: Payer: Self-pay | Admitting: Internal Medicine

## 2015-10-05 NOTE — Telephone Encounter (Signed)
Attempted to contact pt several times/ mailbox full and unable to leave message

## 2015-10-06 ENCOUNTER — Other Ambulatory Visit: Payer: Self-pay | Admitting: Neurosurgery

## 2015-10-06 DIAGNOSIS — S32000A Wedge compression fracture of unspecified lumbar vertebra, initial encounter for closed fracture: Secondary | ICD-10-CM

## 2015-10-14 ENCOUNTER — Ambulatory Visit
Admission: RE | Admit: 2015-10-14 | Discharge: 2015-10-14 | Disposition: A | Discharge status: Home / Self Care | Payer: Commercial Managed Care - HMO | Source: Ambulatory Visit | Attending: Neurosurgery | Admitting: Neurosurgery

## 2015-10-14 DIAGNOSIS — S32000A Wedge compression fracture of unspecified lumbar vertebra, initial encounter for closed fracture: Secondary | ICD-10-CM

## 2015-10-25 ENCOUNTER — Other Ambulatory Visit: Payer: Self-pay | Admitting: Neurosurgery

## 2015-10-25 DIAGNOSIS — S32000A Wedge compression fracture of unspecified lumbar vertebra, initial encounter for closed fracture: Secondary | ICD-10-CM

## 2015-11-02 ENCOUNTER — Ambulatory Visit
Admission: RE | Admit: 2015-11-02 | Discharge: 2015-11-02 | Disposition: A | Discharge status: Home / Self Care | Payer: Commercial Managed Care - HMO | Source: Ambulatory Visit | Attending: Neurosurgery | Admitting: Neurosurgery

## 2015-11-02 DIAGNOSIS — S32000A Wedge compression fracture of unspecified lumbar vertebra, initial encounter for closed fracture: Secondary | ICD-10-CM

## 2015-11-02 HISTORY — PX: IR GENERIC HISTORICAL: IMG1180011

## 2015-11-02 NOTE — Consult Note (Signed)
Chief Complaint: Symptomatic compression fracture  Referring Physician(s): Payne,Mark  History of Present Illness:  Dawn Huang is a 65 y.o. female with past medical history significant for diabetes, hypertension, hyperlipidemia and hypothyroidism who was in her usual baseline state of well health until 09/22/2015 at which time she experienced a fall while doing chores at home. Subsequent lumbar spine CT performed 09/23/2015 demonstrated development of a mild acute compression fracture involving the superior endplate of the L2 vertebral body. At that time, the patient was treated conservatively with being prescribed a back brace and pain medications, however her pain persisted with subsequent lumbar spine MRI performed 10/14/2015 demonstrating interval progression of the now moderate (approximate 50%) height loss involving the L2 vertebral body with minimal (approximately 4 mm) of retropulsion. As such, the patient presents today to the interventional radiology clinic for potential vertebral body cement augmentation. The patient is unaccompanied and serves as her own historian.  Patient states that her pain is intermittent though at its worst can be 9 / 10. Patient states her pain is often worse with prolonged sitting and/or standing. The patient states that her pain was improved while taking pain medication though is since stopped taking these medications as she has returned to work.  The patient continues to be compliant wearing her back place.  Patient denies fever or chills. No lower extremity weakness. No radicular symptoms. No change in bowel or bladder functions.  Prior to his fall, the patient reports minimal/no significant back pain.  Past Medical History  Diagnosis Date  . Hyperlipidemia   . Hypertension   . Diabetes mellitus type 2, uncontrolled (HCC) 2008  . Hypothyroidism   . Depression   . Nephrolithiasis     Past Surgical History  Procedure Laterality Date  .  Tonsillectomy  1968    Allergies: Review of patient's allergies indicates no known allergies.  Medications: Prior to Admission medications   Medication Sig Start Date End Date Taking? Authorizing Provider  amLODipine (NORVASC) 5 MG tablet Take 1 tablet by mouth  daily 07/28/15  Yes Betty G [place], MD  atorvastatin (LIPITOR) 20 MG tablet Take 1 tablet by mouth  Daily with supper. 07/28/15  Yes Betty G [place], MD  BAYER MICROLET LANCETS lancets 1 each by Other route 3 (three) times daily. Use as instructed 07/22/14  Yes Doe-Hyun R Artist Pais, DO  fenofibrate 54 MG tablet Take 1 tablet by mouth  daily 10/01/15  Yes Betty G [place], MD  glucose blood (BAYER CONTOUR NEXT TEST) test strip 1 each by Other route 3 (three) times daily. Use as instructed 07/22/14  Yes Doe-Hyun R Artist Pais, DO  Insulin Detemir (LEVEMIR) 100 UNIT/ML Pen Use 35 units at bedtime as directed 07/28/15  Yes Betty G [place], MD  insulin lispro (HUMALOG) 100 UNIT/ML KiwkPen Use 10 to 15 units ten minutes before your evening meal 07/28/15  Yes Betty G [place], MD  Insulin Pen Needle (AURORA PEN NEEDLES) 31G X 8 MM MISC Use once daily as directed 06/08/15  Yes Nelwyn Salisbury, MD  levothyroxine (SYNTHROID, LEVOTHROID) 100 MCG tablet Take 1 tablet (100 mcg total) by mouth daily. 03/25/15  Yes Nelwyn Salisbury, MD  Liraglutide (VICTOZA) 18 MG/3ML SOPN Inject 1.2 mg into the skin daily. 07/28/15  Yes Betty G [place], MD  metFORMIN (GLUCOPHAGE) 1000 MG tablet Take 1 tablet by mouth  twice a day with meals 07/28/15  Yes Betty G [place], MD  metoprolol succinate (TOPROL-XL) 50 MG 24 hr  tablet Take 1 tablet by mouth  daily 07/28/15  Yes Betty G SwazilandJordan, MD  omeprazole (PRILOSEC) 40 MG capsule Take 1 capsule by mouth  daily before breakfast 09/20/15  Yes Betty G [place]ilandJordan, MD  sertraline (ZOLOFT) 100 MG tablet Take 1 tablet by mouth  daily 07/28/15  Yes Betty G [place]azilandJordan, MD  triamcinolone cream (KENALOG) 0.1 % Apply 1 application topically 2 (two) times daily as needed. 08/02/15  Yes  Betty G [place]il[place]Jordan, MD  valsartan-hydrochlorothiazide (DIOVAN-HCT) 160-25 MG tablet Take one-half tablet by  mouth daily 07/28/15  Yes Betty G [place]ilandJordan, MD  Vitamin D, Ergocalciferol, (DRISDOL) 50000 units CAPS capsule 1 capsule every Friday for 16 weeks then 1 cap every other Friday. 08/02/15  Yes Betty G [place]il[place]Jordan, MD  valsartan (DIOVAN) 80 MG tablet Take 1 tablet (80 mg total) by mouth daily. Patient not taking: Reported on 11/02/2015 08/03/15   Doe-Hyun R Artist PaisYoo, DO     Family History  Problem Relation Age of Onset  . Coronary artery disease Father   . Coronary artery disease Brother   . Breast cancer Daughter     Social History   Social History  . Marital Status: Single    Spouse Name: N/A  . Number of Children: N/A  . Years of Education: N/A   Social History Main Topics  . Smoking status: Never Smoker   . Smokeless tobacco: Never Used  . Alcohol Use: No  . Drug Use: No  . Sexual Activity: Not on file   Other Topics Concern  . Not on file   Social History Narrative   Patient is divorced, she was married twice in the past. She has a son age 67, daughter age 67 who lives in [place], daughter age 67 that lives in [place]   Patient moved to [place] area from [place] 2008    ECOG Status: 1 - Symptomatic but completely ambulatory  Review of Systems: A 12 point ROS discussed and pertinent positives are indicated in the HPI above.  All other systems are negative.  Review of Systems  Constitutional: Positive for activity change. Negative for fever, appetite change and fatigue.  Respiratory: Negative.   Cardiovascular: Negative.   Genitourinary: Negative.  Negative for decreased urine volume.  Musculoskeletal: Positive for back pain.    Vital Signs: BP 163/85 mmHg  Pulse 74  Temp(Src) 98.3 F (36.8 C) (Oral)  Resp 14  Ht 5\' 5"  (1.651 m)  Wt 178 lb (80.74 kg)  BMI 29.62 kg/m2  SpO2 97%  Physical Exam  Constitutional: She appears well-developed and well-nourished.    Cardiovascular: Normal rate and regular rhythm.   Musculoskeletal:       Arms: Location of the patient's midline back pain.  Neurological:  Symmetric 5 out of 5 strength within the bilateral quadriceps musculature  Skin: Skin is warm and dry.  Psychiatric: She has a normal mood and affect. Her behavior is normal.  Nursing note and vitals reviewed.   Mallampati Score:     Imaging: Mr Lumbar Spine Wo Contrast  10/14/2015  CLINICAL DATA:  Patient fell 3 weeks ago.  Pain. EXAM: MRI LUMBAR SPINE WITHOUT CONTRAST TECHNIQUE: Multiplanar, multisequence MR imaging of the lumbar spine was performed. No intravenous contrast was administered. COMPARISON:  [facility] FINDINGS: Segmentation:  Standard. Alignment:  Trace anterolisthesis L4-5. Vertebrae: Acute compression fracture at L2. Bone marrow edema throughout. Greater than 50% loss of vertebral body height, progressed from 09/23/2015. Slight extension of edema to the pedicle  on both sides. Retropulsion of bone into the spinal canal 6 mm. No features suggestive of pathologic compression deformity. Conus medullaris: Extends to the L1 level and appears normal. Paraspinal and other soft tissues: Unremarkable Disc levels: L1-L2: Annular bulge. Retropulsion of bone superior endplate L2 resulting in mild stenosis. No subarticular zone or foraminal zone narrowing. L2-L3: Central protrusion, partially calcified. Posterior element hypertrophy. Mild stenosis. No impingement. L3-L4: Central protrusion. Posterior element hypertrophy. Some extension of disc material to both foramina. No impingement. L4-L5: Moderate disc space narrowing. Central and leftward protrusion. Posterior element hypertrophy. Mild to moderate stenosis. LEFT L4 and LEFT L5 nerve root impingement. L5-S1:  Annular bulge.  No impingement. IMPRESSION: Acute posttraumatic insufficiency fracture of L2, greater than 50% loss of vertebral body height, and 6 mm retropulsion. This  appears to represent a benign fracture, but has moderately progressed over the 3 week interval. Multilevel spondylosis as described. Potentially symptomatic left-sided neural impingement at L4-5. Electronically Signed   By: Elsie Stain M.D.   On: 10/14/2015 14:44    Labs:  CBC:  Recent Labs  06/08/15 1034  WBC 7.9  HGB 13.0  HCT 38.0  PLT 195.0    COAGS: No results for input(s): INR, APTT in the last 8760 hours.  BMP:  Recent Labs  12/07/14 0806 01/11/15 0815 06/08/15 1034 07/28/15 0943  NA 144 140 139 139  K 5.5* 3.8 5.0 3.9  CL 108 107 102 105  CO2 24 21 27 24   GLUCOSE 197* 180* 162* 125*  BUN 23 25* 32* 26*  CALCIUM 10.3 9.4 10.2 9.7  CREATININE 1.29* 1.05 1.43* 1.32*    LIVER FUNCTION TESTS:  Recent Labs  06/08/15 1034  BILITOT 1.1  AST 17  ALT 15  ALKPHOS 62  PROT 7.8  ALBUMIN 4.9    Assessment and Plan:  Tatayana Beshears is a 65 y.o. female with past medical history significant for diabetes, hypertension, hyperlipidemia and hypothyroidism who was in her usual baseline state of well health until 09/22/2015 at which time she experienced a fall while doing chores at home with lumbar spine CT performed 09/23/2015 demonstrated development of a mild compression deformity involving the superior end plate of the L2 vertebral body.  The patient was initially treated conservatively however subsequent lumbar spine MRI performed 10/14/2015 demonstrated interval progression of now moderate (approximately 50%) L2 compression deformity with minimal (approximately 4 mm) of retropulsion.  Prior to this fall, the patient reports no significant low back pain. Physical examination confirms the patient is focally tender with palpation of the upper lumbar spine with no lower extremity weakness or radicular symptoms.  As such, I feel this patient would be an excellent candidate for fluoroscopic guided vertebral body augmentation (either vertebroplasty versus kyphoplasty).  Risks and  benefits of fluoroscopic guided vertebral body augmentation were discussed with the patient including, but not limited to education regarding the natural healing process of compression fractures without intervention, bleeding, infection, cement migration which may cause spinal cord damage, paralysis, pulmonary embolism or even death.  All of the patient's questions were answered, patient is agreeable to proceed.  Due to patient preference, this procedure will be scheduled at [facility], pending insurance approval. Due to scheduling, this procedure will be performed by my interventional radiology colleague, Dr. Corliss Skains, who has reviewed the images and reported critical history is agreeable to perform the procedure.  The procedure will be performed on an outpatient basis.  Thank you for this interesting consult.  I greatly enjoyed meeting  Victoriana Aziz and look forward to participating in their care.  A copy of this report was sent to the requesting provider on this date.  Electronically Signed: Simonne Come 11/02/2015, 1:48 PM   I spent a total of 30 Minutes in face to face in clinical consultation, greater than 50% of which was counseling/coordinating care for symptomatic compression fracture

## 2015-11-05 ENCOUNTER — Other Ambulatory Visit (HOSPITAL_COMMUNITY): Payer: Self-pay | Admitting: Interventional Radiology

## 2015-11-05 ENCOUNTER — Telehealth (HOSPITAL_COMMUNITY): Payer: Self-pay | Admitting: Interventional Radiology

## 2015-11-05 DIAGNOSIS — M545 Low back pain: Secondary | ICD-10-CM

## 2015-11-05 DIAGNOSIS — IMO0002 Reserved for concepts with insufficient information to code with codable children: Secondary | ICD-10-CM

## 2015-11-05 NOTE — Telephone Encounter (Signed)
Called pt to schedule KP/VP. She will call me back with a date that works for her. JM

## 2015-11-11 ENCOUNTER — Other Ambulatory Visit: Payer: Self-pay | Admitting: Radiology

## 2015-11-12 ENCOUNTER — Other Ambulatory Visit (HOSPITAL_COMMUNITY): Payer: Self-pay | Admitting: Interventional Radiology

## 2015-11-12 ENCOUNTER — Encounter (HOSPITAL_COMMUNITY): Payer: Self-pay

## 2015-11-12 ENCOUNTER — Ambulatory Visit (HOSPITAL_COMMUNITY)
Admission: RE | Admit: 2015-11-12 | Discharge: 2015-11-12 | Disposition: A | Discharge status: Home / Self Care | Payer: Commercial Managed Care - HMO | Source: Ambulatory Visit | Attending: Interventional Radiology | Admitting: Interventional Radiology

## 2015-11-12 DIAGNOSIS — I10 Essential (primary) hypertension: Secondary | ICD-10-CM | POA: Insufficient documentation

## 2015-11-12 DIAGNOSIS — E1165 Type 2 diabetes mellitus with hyperglycemia: Secondary | ICD-10-CM | POA: Insufficient documentation

## 2015-11-12 DIAGNOSIS — Z7984 Long term (current) use of oral hypoglycemic drugs: Secondary | ICD-10-CM | POA: Insufficient documentation

## 2015-11-12 DIAGNOSIS — Z803 Family history of malignant neoplasm of breast: Secondary | ICD-10-CM | POA: Insufficient documentation

## 2015-11-12 DIAGNOSIS — Z8249 Family history of ischemic heart disease and other diseases of the circulatory system: Secondary | ICD-10-CM | POA: Insufficient documentation

## 2015-11-12 DIAGNOSIS — S32029A Unspecified fracture of second lumbar vertebra, initial encounter for closed fracture: Secondary | ICD-10-CM | POA: Diagnosis not present

## 2015-11-12 DIAGNOSIS — F329 Major depressive disorder, single episode, unspecified: Secondary | ICD-10-CM | POA: Insufficient documentation

## 2015-11-12 DIAGNOSIS — Z794 Long term (current) use of insulin: Secondary | ICD-10-CM | POA: Insufficient documentation

## 2015-11-12 DIAGNOSIS — X58XXXA Exposure to other specified factors, initial encounter: Secondary | ICD-10-CM | POA: Insufficient documentation

## 2015-11-12 DIAGNOSIS — E785 Hyperlipidemia, unspecified: Secondary | ICD-10-CM | POA: Diagnosis not present

## 2015-11-12 DIAGNOSIS — E039 Hypothyroidism, unspecified: Secondary | ICD-10-CM | POA: Diagnosis not present

## 2015-11-12 DIAGNOSIS — M545 Low back pain: Secondary | ICD-10-CM

## 2015-11-12 DIAGNOSIS — IMO0002 Reserved for concepts with insufficient information to code with codable children: Secondary | ICD-10-CM

## 2015-11-12 DIAGNOSIS — M4856XA Collapsed vertebra, not elsewhere classified, lumbar region, initial encounter for fracture: Secondary | ICD-10-CM | POA: Diagnosis present

## 2015-11-12 DIAGNOSIS — Z87442 Personal history of urinary calculi: Secondary | ICD-10-CM | POA: Insufficient documentation

## 2015-11-12 HISTORY — PX: IR GENERIC HISTORICAL: IMG1180011

## 2015-11-12 LAB — CBC WITH DIFFERENTIAL/PLATELET
BASOS ABS: 0.1 10*3/uL (ref 0.0–0.1)
BASOS PCT: 1 %
EOS ABS: 0.5 10*3/uL (ref 0.0–0.7)
EOS PCT: 9 %
HEMATOCRIT: 34.2 % — AB (ref 36.0–46.0)
Hemoglobin: 11.7 g/dL — ABNORMAL LOW (ref 12.0–15.0)
Lymphocytes Relative: 20 %
Lymphs Abs: 1.1 10*3/uL (ref 0.7–4.0)
MCH: 29.5 pg (ref 26.0–34.0)
MCHC: 34.2 g/dL (ref 30.0–36.0)
MCV: 86.1 fL (ref 78.0–100.0)
MONO ABS: 0.4 10*3/uL (ref 0.1–1.0)
Monocytes Relative: 7 %
NEUTROS ABS: 3.7 10*3/uL (ref 1.7–7.7)
Neutrophils Relative %: 63 %
PLATELETS: 152 10*3/uL (ref 150–400)
RBC: 3.97 MIL/uL (ref 3.87–5.11)
RDW: 13.8 % (ref 11.5–15.5)
WBC: 5.8 10*3/uL (ref 4.0–10.5)

## 2015-11-12 LAB — BASIC METABOLIC PANEL
ANION GAP: 11 (ref 5–15)
BUN: 24 mg/dL — ABNORMAL HIGH (ref 6–20)
CALCIUM: 10 mg/dL (ref 8.9–10.3)
CO2: 25 mmol/L (ref 22–32)
Chloride: 103 mmol/L (ref 101–111)
Creatinine, Ser: 1.43 mg/dL — ABNORMAL HIGH (ref 0.44–1.00)
GFR, EST AFRICAN AMERICAN: 43 mL/min — AB (ref >60)
GFR, EST NON AFRICAN AMERICAN: 37 mL/min — AB (ref >60)
GLUCOSE: 169 mg/dL — AB (ref 65–99)
Potassium: 3.7 mmol/L (ref 3.5–5.1)
Sodium: 139 mmol/L (ref 135–145)

## 2015-11-12 LAB — PROTIME-INR
INR: 1.07
PROTHROMBIN TIME: 13.9 s (ref 11.4–15.2)

## 2015-11-12 LAB — GLUCOSE, CAPILLARY: GLUCOSE-CAPILLARY: 177 mg/dL — AB (ref 65–99)

## 2015-11-12 LAB — APTT: APTT: 25 s (ref 24–36)

## 2015-11-12 MED ORDER — MIDAZOLAM HCL 2 MG/2ML IJ SOLN
INTRAMUSCULAR | Status: AC
  Filled 2015-11-12: qty 2

## 2015-11-12 MED ORDER — BUPIVACAINE HCL (PF) 0.25 % IJ SOLN
INTRAMUSCULAR | Status: AC
  Administered 2015-11-12: 30 mL
  Filled 2015-11-12: qty 30

## 2015-11-12 MED ORDER — MIDAZOLAM HCL 2 MG/2ML IJ SOLN
INTRAMUSCULAR | Status: AC
  Filled 2015-11-12: qty 4

## 2015-11-12 MED ORDER — IOPAMIDOL (ISOVUE-300) INJECTION 61%
INTRAVENOUS | Status: AC
  Administered 2015-11-12: 5 mL
  Filled 2015-11-12: qty 50

## 2015-11-12 MED ORDER — SODIUM CHLORIDE 0.9 % IV SOLN: INTRAVENOUS | Status: DC

## 2015-11-12 MED ORDER — FENTANYL CITRATE (PF) 100 MCG/2ML IJ SOLN
INTRAMUSCULAR | Status: AC
  Filled 2015-11-12: qty 4

## 2015-11-12 MED ORDER — CEFAZOLIN SODIUM-DEXTROSE 2-4 GM/100ML-% IV SOLN
2.0000 g | Freq: Once | INTRAVENOUS | Status: AC
  Administered 2015-11-12: 2 g via INTRAVENOUS

## 2015-11-12 MED ORDER — MIDAZOLAM HCL 2 MG/2ML IJ SOLN
INTRAMUSCULAR | Status: AC | PRN
  Administered 2015-11-12 (×2): 1 mg via INTRAVENOUS

## 2015-11-12 MED ORDER — BUPIVACAINE HCL (PF) 0.25 % IJ SOLN
INTRAMUSCULAR | Status: AC
  Administered 2015-11-12: 10 mL
  Filled 2015-11-12: qty 30

## 2015-11-12 MED ORDER — TOBRAMYCIN SULFATE 1.2 G IJ SOLR
INTRAMUSCULAR | Status: AC
  Administered 2015-11-12: 1.2 g
  Filled 2015-11-12: qty 1.2

## 2015-11-12 MED ORDER — HYDROMORPHONE HCL 1 MG/ML IJ SOLN
INTRAMUSCULAR | Status: AC
  Filled 2015-11-12: qty 1

## 2015-11-12 MED ORDER — HYDROMORPHONE HCL 1 MG/ML IJ SOLN
INTRAMUSCULAR | Status: AC
  Filled 2015-11-12: qty 2

## 2015-11-12 MED ORDER — FENTANYL CITRATE (PF) 100 MCG/2ML IJ SOLN
INTRAMUSCULAR | Status: AC | PRN
  Administered 2015-11-12 (×2): 25 ug via INTRAVENOUS

## 2015-11-12 MED ORDER — CEFAZOLIN SODIUM-DEXTROSE 2-4 GM/100ML-% IV SOLN
INTRAVENOUS | Status: AC
  Administered 2015-11-12: 2 g via INTRAVENOUS
  Filled 2015-11-12: qty 100

## 2015-11-12 MED ORDER — HYDROMORPHONE HCL 1 MG/ML IJ SOLN
INTRAMUSCULAR | Status: AC | PRN
  Administered 2015-11-12: 1 mg via INTRAVENOUS

## 2015-11-12 NOTE — Sedation Documentation (Signed)
Dsg to back intact, dry

## 2015-11-12 NOTE — Discharge Instructions (Signed)
Return to Work ________________________[name]__________________________ was treated at our facility. INJURY OR ILLNESS WAS: _____ Work related _____ Not work related __X___ Undetermined if work related RETURN TO WORK  Employee may return to work on _____8/7/2017_______________.  Employee may return to modified work on ____________________. WORK ACTIVITY RESTRICTIONS Work activities that are not tolerated include: __X___ Bending _____ Prolonged sitting __X___ Lifting more than ____10________________ lb ___X__ Squatting _____ Prolonged standing __X___ Dawn Huang _____ Reaching __X___ Pushing and pulling _____ Walking _____ Other ____________________ These restrictions are effective until ___7/14/2017_________________. Show this Return to Work statement to your supervisor at work as soon as possible. Your employer should be aware of your condition and can help with the necessary work activity restrictions. If you wish to return to work sooner than the date that is listed above, or if you have further problems that make it difficult for you to return at that time, please call our clinic or your health care provider. _________________________________________ Health Care Provider Name (printed) _________________________________________ Health Care Provider (signature)  _________________________________________ Date   This information is not intended to replace advice given to you by your health care provider. Make sure you discuss any questions you have with your health care provider.   Document Released: 04/03/2005 Document Revised: 04/24/2014 Document Reviewed: 10/31/2013 Elsevier Interactive Patient Education 2016 [employer] KYPHOPLASTY/VERTEBROPLASTY DISCHARGE INSTRUCTIONS  Medications: (check all that apply)     Resume all home medications as before procedure.                   Continue your pain medications as prescribed as needed.  Over the next 3-5 days, decrease  your pain medication as tolerated.  Over the counter medications (i.e. Tylenol, ibuprofen, and aleve) may be substituted once severe/moderate pain symptoms have subsided.   Wound Care: - Bandages may be removed the day following your procedure.  You may get your incision wet once bandages are removed.  Bandaids may be used to cover the incisions until scab formation.  Topical ointments are optional.  - If you develop a fever greater than 101 degrees, have increased skin redness at the incision sites or pus-like oozing from incisions occurring within 1 week of the procedure, contact radiology at [phone] or [phone].  - Ice pack to back for 15-20 minutes 2-3 time per day for first 2-3 days post procedure.  The ice will expedite muscle healing and help with the pain from the incisions.   Activity: - Bedrest today with limited activity for 24 hours post procedure.  - No driving for 48 hours.  - Increase your activity as tolerated after bedrest (with assistance if necessary).  - Refrain from any strenuous activity or heavy lifting (greater than 10 lbs.).   Follow up: - Contact radiology at [phone] or [phone] if any questions/concerns.  - A physician assistant from radiology will contact you in approximately 1 week.  - If a biopsy was performed at the time of your procedure, your referring physician should receive the results in usually 2-3 days.        1.No stooping,bending or lifting  More than 10 lbs for 2 weeks 2.Use walker to ambulate for 2 weeks. RTC in 2 weeks

## 2015-11-12 NOTE — Sedation Documentation (Signed)
Patient is resting comfortably. 

## 2015-11-12 NOTE — Procedures (Signed)
S/PL2 VP 

## 2015-11-12 NOTE — H&P (Signed)
Chief Complaint: L2 compression fracture  Referring Physician: Dr. Bonnee Quin  Supervising Physician: Luanne Bras  Patient Status:  Out-pt  HPI: Dawn Huang is an 65 y.o. female who fell onto her backside on June 8.  She began having horrible low back pain after that traumatic accident.  She subsequently had a CT scan that revealed an acute L2 compression fracture.  She was being treated conservatively with a brace and pain meds; however, her pain has persisted.  She saw Dr. Pascal Lux in the office about a week and a half ago for evaluation for VP vs KP as her pain has not improved with conservative management.  Due to timing and scheduling, he was unable to do her procedure and asked Dr. Estanislado Pandy to review her imaging, which he did, and agreed to do the procedure.  She presents today for this procedure.  Past Medical History:  Past Medical History:  Diagnosis Date  . Depression   . Diabetes mellitus type 2, uncontrolled ([facility]) 2008  . Hyperlipidemia   . Hypertension   . Hypothyroidism   . Nephrolithiasis     Past Surgical History:  Past Surgical History:  Procedure Laterality Date  . TONSILLECTOMY  1968    Family History:  Family History  Problem Relation Age of Onset  . Coronary artery disease Father   . Coronary artery disease Brother   . Breast cancer Daughter     Social History:  reports that she has never smoked. She has never used smokeless tobacco. She reports that she does not drink alcohol or use drugs.  Allergies: No Known Allergies  Medications:   Medication List    ASK your doctor about these medications   amLODipine 5 MG tablet Commonly known as:  NORVASC Take 1 tablet by mouth  daily   atorvastatin 20 MG tablet Commonly known as:  LIPITOR Take 1 tablet by mouth  Daily with supper.   BAYER MICROLET LANCETS lancets 1 each by Other route 3 (three) times daily. Use as instructed   fenofibrate 54 MG tablet Take 1 tablet by mouth  daily     glucose blood test strip Commonly known as:  BAYER CONTOUR NEXT TEST 1 each by Other route 3 (three) times daily. Use as instructed   Insulin Detemir 100 UNIT/ML Pen Commonly known as:  LEVEMIR Use 35 units at bedtime as directed   insulin lispro 100 UNIT/ML KiwkPen Commonly known as:  HUMALOG Use 10 to 15 units ten minutes before your evening meal   Insulin Pen Needle 31G X 8 MM Misc Commonly known as:  AURORA PEN NEEDLES Use once daily as directed   levothyroxine 100 MCG tablet Commonly known as:  SYNTHROID, LEVOTHROID Take 1 tablet (100 mcg total) by mouth daily.   Liraglutide 18 MG/3ML Sopn Commonly known as:  VICTOZA Inject 1.2 mg into the skin daily.   metFORMIN 1000 MG tablet Commonly known as:  GLUCOPHAGE Take 1 tablet by mouth  twice a day with meals   metoprolol succinate 50 MG 24 hr tablet Commonly known as:  TOPROL-XL Take 1 tablet by mouth  daily   omeprazole 40 MG capsule Commonly known as:  PRILOSEC Take 1 capsule by mouth  daily before breakfast   sertraline 100 MG tablet Commonly known as:  ZOLOFT Take 1 tablet by mouth  daily   sulindac 150 MG tablet Commonly known as:  CLINORIL Take 150 mg by mouth 2 (two) times daily.   triamcinolone cream 0.1 % Commonly  known as:  KENALOG Apply 1 application topically 2 (two) times daily as needed.   valsartan-hydrochlorothiazide 160-25 MG tablet Commonly known as:  DIOVAN-HCT Take one-half tablet by  mouth daily   Vitamin D (Ergocalciferol) 50000 units Caps capsule Commonly known as:  DRISDOL 1 capsule every Friday for 16 weeks then 1 cap every other Friday.       Please HPI for pertinent positives, otherwise complete 10 system ROS negative.  Mallampati Score: MD Evaluation Airway: WNL Heart: WNL Abdomen: WNL Chest/ Lungs: WNL ASA  Classification: 2 Mallampati/Airway Score: Two  Physical Exam: BP (!) 146/88   Pulse 70   Temp 98 F (36.7 C) (Oral)   Resp 18   Ht 5\' 5"  (1.651 m)   Wt  178 lb (80.7 kg)   SpO2 98%   BMI 29.62 kg/m  Body mass index is 29.62 kg/m. General: pleasant, obese white female who is laying in bed in NAD HEENT: head is normocephalic, atraumatic.  Sclera are noninjected.  PERRL.  Ears and nose without any masses or lesions.  Mouth is pink and moist Heart: regular, rate, and rhythm.  Normal s1,s2. No obvious murmurs, gallops, or rubs noted.  Palpable radial and pedal pulses bilaterally Lungs: CTAB, no wheezes, rhonchi, or rales noted.  Respiratory effort nonlabored Abd: soft, NT, ND, obese, +BS, no masses, hernias, or organomegaly MS: all 4 extremities are symmetrical with no cyanosis, clubbing, or edema.  Minimal tenderness to palpation in lumbar region of the spine Psych: A&Ox3 with an appropriate affect.   Labs: Results for orders placed or performed during the hospital encounter of 11/12/15 (from the past 48 hour(s))  Basic metabolic panel     Status: Abnormal   Collection Time: 11/12/15  6:40 AM  Result Value Ref Range   Sodium 139 135 - 145 mmol/L   Potassium 3.7 3.5 - 5.1 mmol/L   Chloride 103 101 - 111 mmol/L   CO2 25 22 - 32 mmol/L   Glucose, Bld 169 (H) 65 - 99 mg/dL   BUN 24 (H) 6 - 20 mg/dL   Creatinine, Ser 11/14/15 (H) 0.44 - 1.00 mg/dL   Calcium 4.37 8.9 - 35.7 mg/dL   GFR calc non Af Amer 37 (L) >60 mL/min   GFR calc Af Amer 43 (L) >60 mL/min    Comment: (NOTE) The eGFR has been calculated using the CKD EPI equation. This calculation has not been validated in all clinical situations. eGFR's persistently <60 mL/min signify possible Chronic Kidney Disease.    Anion gap 11 5 - 15  CBC with Differential/Platelet     Status: Abnormal   Collection Time: 11/12/15  6:40 AM  Result Value Ref Range   WBC 5.8 4.0 - 10.5 K/uL   RBC 3.97 3.87 - 5.11 MIL/uL   Hemoglobin 11.7 (L) 12.0 - 15.0 g/dL   HCT 11/14/15 (L) 84.7 - 84.1 %   MCV 86.1 78.0 - 100.0 fL   MCH 29.5 26.0 - 34.0 pg   MCHC 34.2 30.0 - 36.0 g/dL   RDW 28.2 08.1 - 38.8 %    Platelets 152 150 - 400 K/uL   Neutrophils Relative % 63 %   Neutro Abs 3.7 1.7 - 7.7 K/uL   Lymphocytes Relative 20 %   Lymphs Abs 1.1 0.7 - 4.0 K/uL   Monocytes Relative 7 %   Monocytes Absolute 0.4 0.1 - 1.0 K/uL   Eosinophils Relative 9 %   Eosinophils Absolute 0.5 0.0 - 0.7 K/uL   Basophils  Relative 1 %   Basophils Absolute 0.1 0.0 - 0.1 K/uL  Glucose, capillary     Status: Abnormal   Collection Time: 11/12/15  6:58 AM  Result Value Ref Range   Glucose-Capillary 177 (H) 65 - 99 mg/dL    Imaging: No results found.  Assessment/Plan 1. L2 compression fracture -labs and vitals have been reviewed.  Creatinine is 1.43 which appears to be around her baseline. -we will proceed with VP vs KP of L2 compression fracture today -Risks and Benefits discussed with the patient including, but not limited to education regarding the natural healing process of compression fractures without intervention, bleeding, infection, cement migration which may cause spinal cord damage, paralysis, pulmonary embolism or even death. All of the patient's questions were answered, patient is agreeable to proceed. Consent signed and in chart.   Thank you for this interesting consult.  I greatly enjoyed meeting Trude Cansler and look forward to participating in their care.  A copy of this report was sent to the requesting provider on this date.  Electronically Signed: Henreitta Cea 11/12/2015, 7:54 AM   I spent a total of    25 Minutes in face to face in clinical consultation, greater than 50% of which was counseling/coordinating care for L2 compression fracture

## 2015-11-16 ENCOUNTER — Other Ambulatory Visit (HOSPITAL_COMMUNITY): Payer: Self-pay | Admitting: Interventional Radiology

## 2015-11-16 DIAGNOSIS — IMO0002 Reserved for concepts with insufficient information to code with codable children: Secondary | ICD-10-CM

## 2015-11-16 DIAGNOSIS — M549 Dorsalgia, unspecified: Secondary | ICD-10-CM

## 2015-11-25 ENCOUNTER — Ambulatory Visit (HOSPITAL_COMMUNITY): Admission: RE | Admit: 2015-11-25 | Payer: Commercial Managed Care - HMO | Source: Ambulatory Visit

## 2015-11-30 ENCOUNTER — Ambulatory Visit (INDEPENDENT_AMBULATORY_CARE_PROVIDER_SITE_OTHER): Payer: Commercial Managed Care - HMO | Admitting: Family Medicine

## 2015-11-30 ENCOUNTER — Encounter: Payer: Self-pay | Admitting: Family Medicine

## 2015-11-30 VITALS — BP 138/85 | HR 92 | Resp 12 | Ht 65.0 in | Wt 179.4 lb

## 2015-11-30 DIAGNOSIS — N183 Chronic kidney disease, stage 3 unspecified: Secondary | ICD-10-CM

## 2015-11-30 DIAGNOSIS — R059 Cough, unspecified: Secondary | ICD-10-CM

## 2015-11-30 DIAGNOSIS — Z794 Long term (current) use of insulin: Secondary | ICD-10-CM | POA: Diagnosis not present

## 2015-11-30 DIAGNOSIS — R05 Cough: Secondary | ICD-10-CM

## 2015-11-30 DIAGNOSIS — E1165 Type 2 diabetes mellitus with hyperglycemia: Secondary | ICD-10-CM

## 2015-11-30 DIAGNOSIS — E1149 Type 2 diabetes mellitus with other diabetic neurological complication: Secondary | ICD-10-CM

## 2015-11-30 DIAGNOSIS — IMO0002 Reserved for concepts with insufficient information to code with codable children: Secondary | ICD-10-CM

## 2015-11-30 DIAGNOSIS — I10 Essential (primary) hypertension: Secondary | ICD-10-CM | POA: Diagnosis not present

## 2015-11-30 DIAGNOSIS — E559 Vitamin D deficiency, unspecified: Secondary | ICD-10-CM | POA: Diagnosis not present

## 2015-11-30 LAB — BASIC METABOLIC PANEL
BUN: 20 mg/dL (ref 6–23)
CALCIUM: 10.4 mg/dL (ref 8.4–10.5)
CO2: 26 meq/L (ref 19–32)
Chloride: 102 mEq/L (ref 96–112)
Creatinine, Ser: 1.31 mg/dL — ABNORMAL HIGH (ref 0.40–1.20)
GFR: 42.96 mL/min — ABNORMAL LOW (ref >60.00)
GLUCOSE: 126 mg/dL — AB (ref 70–99)
Potassium: 4.2 mEq/L (ref 3.5–5.1)
SODIUM: 140 meq/L (ref 135–145)

## 2015-11-30 LAB — HEMOGLOBIN A1C: Hgb A1c MFr Bld: 7 % — ABNORMAL HIGH (ref 4.6–6.5)

## 2015-11-30 LAB — VITAMIN D 25 HYDROXY (VIT D DEFICIENCY, FRACTURES): VITD: 29.88 ng/mL — ABNORMAL LOW (ref 30.00–100.00)

## 2015-11-30 MED ORDER — VALSARTAN-HYDROCHLOROTHIAZIDE 160-25 MG PO TABS: 0.5000 | ORAL_TABLET | Freq: Every day | ORAL | 2 refills | Status: DC

## 2015-11-30 MED ORDER — METOPROLOL SUCCINATE ER 50 MG PO TB24: 50.0000 mg | ORAL_TABLET | Freq: Every day | ORAL | 1 refills | Status: DC

## 2015-11-30 MED ORDER — AMLODIPINE BESYLATE 5 MG PO TABS: 5.0000 mg | ORAL_TABLET | Freq: Every day | ORAL | 1 refills | Status: DC

## 2015-11-30 NOTE — Patient Instructions (Addendum)
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 Flonase nasal spray and Allegra 180 mg daiy.   HgA1C goal < 7.0. Avoid sugar added food:regular soft drinks, energy drinks, and sports drinks. candy. cakes. cookies. pies and cobblers. sweet rolls, pastries, and donuts. fruit drinks, such as fruitades and fruit punch. dairy desserts, such as ice cream  Mediterranean diet has showed benefits for sugar control.  How much and what type of carbohydrate foods are important for managing diabetes. The balance between how much insulin is in your body and the carbohydrate you eat makes a difference in your blood glucose levels.  Fasting blood sugar ideally 130 or less, 2 hours after meals less than 180.   Regular exercise also will help with controlling disease, daily brisk walking as tolerated for 15-30 min definitively will help.   Avoid skipping meals, blood sugar might drop and cause serious problems. Remember checking feet periodically, good dental hygiene, and annual eye exam.     Please be sure medication list is accurate. If a new problem present, please set up appointment sooner than planned today.

## 2015-11-30 NOTE — Progress Notes (Signed)
Pre visit review using our clinic review tool, if applicable. No additional management support is needed unless otherwise documented below in the visit note. 

## 2015-11-30 NOTE — Progress Notes (Signed)
HPI:   Dawn Huang is a 65 y.o. female, who is here today to follow on some of her chronic medical problems.     Hypertension: Currently she is on Amlodiine.Metoprolol Succinate, and Diovan-HCT. She taking medications as instructed, no side effects reported.  She has not noted unusual headache, visual changes, exertional chest pain, dyspnea,  focal weakness, or edema.  Lab Results  Component Value Date   CREATININE 1.43 (H) 11/12/2015   BUN 24 (H) 11/12/2015   NA 139 11/12/2015   K 3.7 11/12/2015   CL 103 11/12/2015   CO2 25 11/12/2015     Vit D deficiency, she took Ergocalciferol 50,000 U until a month ago. CKD III, she has not noted gross hematuria or foam in urine.   Diabetes Mellitus II:   She is currently on Levemir 35 U daily, Humalog 12 U am and 15 U pm, Victoza 1.8 mg daily, and Metformin 1000 mg bid  No hypoglycemia.  She denies abdominal pain, nausea, vomiting, polydipsia, polyuria, or polyphagia. No numbness, tingling, or burning.  She has improved her diet for the past 4-5 weeks.   Lab Results  Component Value Date   CREATININE 1.43 (H) 11/12/2015   BUN 24 (H) 11/12/2015   NA 139 11/12/2015   K 3.7 11/12/2015   CL 103 11/12/2015   CO2 25 11/12/2015    Lab Results  Component Value Date   HGBA1C 6.8 (H) 07/28/2015   Lab Results  Component Value Date   MICROALBUR 0.8 07/28/2015    Lab Results  Component Value Date   CHOL 148 07/28/2015   HDL 55.00 07/28/2015   LDLCALC 67 07/28/2015   LDLDIRECT 100.5 09/16/2012   TRIG 130.0 07/28/2015   CHOLHDL 3 07/28/2015    Concerns today: cough for a few days. She has had persistent cough in the past. Hx of GERD, has not had heartburn or acid reflux, she is not on Omeprazole, took it for 6-8 weeks.   + post nasal drainage and nasal congestion. No taking antihystamin or nasal spray. No sick contact. No fever, chills, or muscle aching.  S/P vertebroplasty 11/12/15, L2 compression  fracture. Back pain has improved greatly.   Review of Systems  Constitutional: Negative for activity change, appetite change, fatigue, fever and unexpected weight change.  HENT: Positive for congestion, postnasal drip and rhinorrhea. Negative for mouth sores, nosebleeds and trouble swallowing.   Eyes: Negative for redness and visual disturbance.  Respiratory: Positive for cough. Negative for shortness of breath and wheezing.   Cardiovascular: Negative for chest pain, palpitations and leg swelling.  Gastrointestinal: Negative for abdominal pain, nausea and vomiting.       Negative for changes in bowel habits.  Endocrine: Negative for polydipsia, polyphagia and polyuria.  Genitourinary: Negative for decreased urine volume, difficulty urinating, dysuria and hematuria.  Skin: Negative for color change and rash.  Neurological: Negative for seizures, syncope, weakness, numbness and headaches.  Psychiatric/Behavioral: Negative for confusion. The patient is not nervous/anxious.       Current Outpatient Prescriptions on File Prior to Visit  Medication Sig Dispense Refill  . atorvastatin (LIPITOR) 20 MG tablet Take 1 tablet by mouth  Daily with supper. (Patient taking differently: Take 20 mg by mouth daily at 6 PM. ) 90 tablet 2  . BAYER MICROLET LANCETS lancets 1 each by Other route 3 (three) times daily. Use as instructed 300 each 3  . glucose blood (BAYER CONTOUR NEXT TEST) test strip 1 each by  Other route 3 (three) times daily. Use as instructed 300 each 3  . Insulin Detemir (LEVEMIR) 100 UNIT/ML Pen Use 35 units at bedtime as directed 5 pen 11  . insulin lispro (HUMALOG) 100 UNIT/ML KiwkPen Use 10 to 15 units ten minutes before your evening meal 2 pen 11  . Insulin Pen Needle (AURORA PEN NEEDLES) 31G X 8 MM MISC Use once daily as directed 100 each 5  . levothyroxine (SYNTHROID, LEVOTHROID) 100 MCG tablet Take 1 tablet (100 mcg total) by mouth daily. 90 tablet 3  . Liraglutide (VICTOZA) 18  MG/3ML SOPN Inject 1.2 mg into the skin daily. (Patient taking differently: Inject 1.2 mg into the skin daily. ) 18 mL 3  . metFORMIN (GLUCOPHAGE) 1000 MG tablet Take 1 tablet by mouth  twice a day with meals (Patient taking differently: Take 1,000 mg by mouth 2 (two) times daily with a meal. ) 180 tablet 2  . sertraline (ZOLOFT) 100 MG tablet Take 1 tablet by mouth  daily (Patient taking differently: Take 100 mg by mouth daily. ) 90 tablet 1  . sulindac (CLINORIL) 150 MG tablet Take 150 mg by mouth 2 (two) times daily.  1  . triamcinolone cream (KENALOG) 0.1 % Apply 1 application topically 2 (two) times daily as needed. (Patient taking differently: Apply 1 application topically 2 (two) times daily as needed (for itching). ) 45 g 1   No current facility-administered medications on file prior to visit.      Past Medical History:  Diagnosis Date  . Depression   . Diabetes mellitus type 2, uncontrolled (HCC) 2008  . Hyperlipidemia   . Hypertension   . Hypothyroidism   . Nephrolithiasis    No Known Allergies  Social History   Social History  . Marital status: Single    Spouse name: Dawn Huang  . Number of children: Dawn Huang  . Years of education: Dawn Huang   Social History Main Topics  . Smoking status: Never Smoker  . Smokeless tobacco: Never Used  . Alcohol use No  . Drug use: No  . Sexual activity: Not Asked   Other Topics Concern  . None   Social History Narrative   Patient is divorced, she was married twice in the past. She has a son age 67, daughter age 838 who lives in [place], daughter age 67 that lives in [place]   Patient moved to [place] area from [place] 2008    Vitals:   11/30/15 0924  BP: 138/85  Pulse: 92  Resp: 12   Body mass index is 29.85 kg/m.      Physical Exam  Constitutional: She is oriented to person, place, and time. She appears well-developed. No distress.  HENT:  Head: Atraumatic.  Nose: Rhinorrhea present. Right sinus exhibits no maxillary sinus  tenderness and no frontal sinus tenderness. Left sinus exhibits no maxillary sinus tenderness and no frontal sinus tenderness.  Mouth/Throat: Oropharynx is clear and moist and mucous membranes are normal.  Eyes: Conjunctivae and EOM are normal. Pupils are equal, round, and reactive to light.  Neck: Thyromegaly (mild, no masses.) present.  Cardiovascular: Normal rate and regular rhythm.   No murmur heard. Respiratory: Effort normal and breath sounds normal. No respiratory distress.  GI: Soft. She exhibits no mass. There is no hepatomegaly. There is no tenderness.  Musculoskeletal: She exhibits no edema.  Lymphadenopathy:    She has no cervical adenopathy.  Neurological: She is alert and oriented to person, place, and time. She has normal strength. Coordination  and gait normal.  Skin: Skin is warm. No erythema.  Psychiatric: She has a normal mood and affect.  Well groomed, good eye contact.      ASSESSMENT AND PLAN:  Lab Results  Component Value Date   HGBA1C 7.0 (H) 11/30/2015    Lab Results  Component Value Date   CREATININE 1.31 (H) 11/30/2015   BUN 20 11/30/2015   NA 140 11/30/2015   K 4.2 11/30/2015   CL 102 11/30/2015   CO2 26 11/30/2015     Sharnae was seen today for follow-up.  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 No changes in current management, will adjust treatment according to lab results. Regular exercise and healthy diet with avoidance of added sugar food intake is an important part of treatment and recommended. Annual eye exam, periodic dental and foot care recommended. F/U in 5-6 months  -     Hemoglobin A1c  Chronic kidney disease (CKD), stage III (moderate)  Stable. Low salt diet, avoid NSAID's recommended. Continue Diovan.   -     Basic Metabolic Panel -     valsartan-hydrochlorothiazide (DIOVAN-HCT)  160-25 MG tablet; Take 0.5 tablets by mouth daily. Take one-half tablet by  mouth daily  Essential hypertension   Adequately controlled. No changes in current management. DASH diet recommended. Eye exam recommended annually. F/U in 6 months, before if needed.  -     Basic Metabolic Panel -     amLODipine (NORVASC) 5 MG tablet; Take 1 tablet (5 mg total) by mouth daily. -     metoprolol succinate (TOPROL-XL) 50 MG 24 hr tablet; Take 1 tablet (50 mg total) by mouth daily. -     valsartan-hydrochlorothiazide (DIOVAN-HCT) 160-25 MG tablet; Take 0.5 tablets by mouth daily. Take one-half tablet by  mouth daily  Vitamin D deficiency  She is not on Vit D supplementation, will follow labs done today and will give further recommendations accordingly.  -     VITAMIN D 25 Hydroxy (Vit-D Deficiency, Fractures)  Cough  ? Allergies vs GERD. For now try OTC Allegra 180 mg daily and Flonase nasal spray. If persistent, she may want to try Omeprazole again. Instructed about warning signs and f/u as needed.        -Ms. Loma Dubuque was advised to return sooner than planned today if new concerns arise.       Betty G. [place], MD  [facility]. [place] office.

## 2015-12-01 ENCOUNTER — Other Ambulatory Visit: Payer: Self-pay

## 2015-12-01 MED ORDER — INSULIN GLARGINE 300 UNIT/ML ~~LOC~~ SOPN: 45.0000 [IU] | PEN_INJECTOR | Freq: Every day | SUBCUTANEOUS | 1 refills | Status: DC

## 2015-12-13 ENCOUNTER — Telehealth: Payer: Self-pay | Admitting: Family Medicine

## 2015-12-13 NOTE — Telephone Encounter (Signed)
PA has been submitted to CoverMyMeds for Toujeo.  Waiting on response.

## 2015-12-14 ENCOUNTER — Encounter (HOSPITAL_COMMUNITY): Payer: Self-pay | Admitting: Interventional Radiology

## 2016-01-14 ENCOUNTER — Encounter: Payer: Self-pay | Admitting: Interventional Radiology

## 2016-03-05 ENCOUNTER — Other Ambulatory Visit: Payer: Self-pay | Admitting: Family Medicine

## 2016-03-14 ENCOUNTER — Other Ambulatory Visit: Payer: Self-pay | Admitting: Family Medicine

## 2016-03-15 NOTE — Telephone Encounter (Signed)
I don't see this on her current medication list. Should she still be taking it?

## 2016-03-15 NOTE — Telephone Encounter (Signed)
In 07/2015 after FLP I recommended stopping Fenofibrate and continue Lipitor + improving diet. I am not sure if pharmacy is not aware she stopped it or she continue it, please verify information with pt and she still wants to take it, it can be sent to pharmacy. Keep next f/u appt as recommended last OV. Thanks, BJ

## 2016-03-16 NOTE — Telephone Encounter (Signed)
Called and spoke to patient. She is still taking the Fenofibrate, so rx sent to pharmacy. Patient has a follow up scheduled.

## 2016-03-19 ENCOUNTER — Other Ambulatory Visit: Payer: Self-pay | Admitting: Family Medicine

## 2016-03-19 DIAGNOSIS — F32A Depression, unspecified: Secondary | ICD-10-CM

## 2016-03-19 DIAGNOSIS — F329 Major depressive disorder, single episode, unspecified: Secondary | ICD-10-CM

## 2016-05-15 ENCOUNTER — Other Ambulatory Visit: Payer: Self-pay | Admitting: Family Medicine

## 2016-05-15 DIAGNOSIS — N183 Chronic kidney disease, stage 3 unspecified: Secondary | ICD-10-CM

## 2016-05-15 DIAGNOSIS — E1122 Type 2 diabetes mellitus with diabetic chronic kidney disease: Secondary | ICD-10-CM

## 2016-05-15 DIAGNOSIS — Z794 Long term (current) use of insulin: Principal | ICD-10-CM

## 2016-06-01 ENCOUNTER — Ambulatory Visit: Payer: Commercial Managed Care - HMO | Admitting: Family Medicine

## 2016-06-06 ENCOUNTER — Ambulatory Visit (INDEPENDENT_AMBULATORY_CARE_PROVIDER_SITE_OTHER): Payer: Commercial Managed Care - HMO | Admitting: Family Medicine

## 2016-06-06 ENCOUNTER — Encounter: Payer: Self-pay | Admitting: Family Medicine

## 2016-06-06 VITALS — BP 135/75 | HR 88 | Resp 12 | Ht 65.0 in | Wt 180.2 lb

## 2016-06-06 DIAGNOSIS — E1149 Type 2 diabetes mellitus with other diabetic neurological complication: Secondary | ICD-10-CM | POA: Diagnosis not present

## 2016-06-06 DIAGNOSIS — I10 Essential (primary) hypertension: Secondary | ICD-10-CM

## 2016-06-06 DIAGNOSIS — Z780 Asymptomatic menopausal state: Secondary | ICD-10-CM

## 2016-06-06 DIAGNOSIS — Z794 Long term (current) use of insulin: Secondary | ICD-10-CM

## 2016-06-06 DIAGNOSIS — E1165 Type 2 diabetes mellitus with hyperglycemia: Secondary | ICD-10-CM | POA: Diagnosis not present

## 2016-06-06 DIAGNOSIS — Z0000 Encounter for general adult medical examination without abnormal findings: Secondary | ICD-10-CM | POA: Diagnosis not present

## 2016-06-06 DIAGNOSIS — IMO0002 Reserved for concepts with insufficient information to code with codable children: Secondary | ICD-10-CM

## 2016-06-06 DIAGNOSIS — E039 Hypothyroidism, unspecified: Secondary | ICD-10-CM

## 2016-06-06 NOTE — Progress Notes (Signed)
HPI:   [name] is a 66 y.o. female, who is here today for her routine physical.  Regular exercise 3 or more time per week: No Following a healthy diet: No She lives with husband and son. Independent ADL's and IADL's.   Chronic medical problems: DM II,CKD,vit D deficiency, HTN, and HLD among some. Also Hx of depression, about 2 months ago her daughter died, breast cancer. She feels like she is dealing well with this.   Pap smear: Last one in her mid 30's Hx of abnormal pap smears: Denies Hx of STD's Denies. She is not sexually active.  Last pap smear reported in her mid 31's.  Mammogram: 2016 Birads 1 Colonoscopy: 07/2015. DEXA: Has not had one. Hep C screening: Denies risk factors. FHx for colon cancer negative.  She has no concerns today. She is also following on some of her chronic medical problems.  DM II:  She is on Toujeo 45 U, Humalog was discontinued. Also on Victoza and Metformin.  She is not checking BS's. She denies hypoglycemic events. Tolerating medications well and no side effects reported.   Hypertension:  Currently on Metoprolol Succinate 50 mg daily,Amlodipine 5 mg, and Diovan HCT.  She has not noted unusual headache, visual changes, exertional chest pain, dyspnea,  focal weakness, or edema. +CKD III.  Hypothyroidism:  She is on Levothyroxine 100 mcg daily. She has not noted cold/heat intolerance, changes in bowel habits,or abnormal wt changes.  HLD: On Fenofibrate 45 mg daily and Lipitor 20 mg daily.    Review of Systems  Constitutional: Negative for appetite change, fatigue and fever.  HENT: Positive for dental problem. Negative for hearing loss, mouth sores, trouble swallowing and voice change.   Eyes: Negative for photophobia, pain and visual disturbance.  Respiratory: Negative for cough, shortness of breath and wheezing.   Cardiovascular: Negative for chest pain, palpitations and leg swelling.  Gastrointestinal: Negative  for abdominal pain, nausea and vomiting.       No changes in bowel habits.  Endocrine: Negative for cold intolerance, heat intolerance, polydipsia, polyphagia and polyuria.  Genitourinary: Negative for decreased urine volume, dysuria, hematuria, vaginal bleeding and vaginal discharge.  Musculoskeletal: Negative for gait problem and neck pain.  Skin: Negative for color change and rash.  Neurological: Negative for syncope, weakness and headaches.  Hematological: Negative for adenopathy. Does not bruise/bleed easily.  Psychiatric/Behavioral: Negative for confusion, sleep disturbance and suicidal ideas. The patient is not nervous/anxious.   All other systems reviewed and are negative.     Current Outpatient Prescriptions on File Prior to Visit  Medication Sig Dispense Refill  . amLODipine (NORVASC) 5 MG tablet Take 1 tablet (5 mg total) by mouth daily. 90 tablet 1  . atorvastatin (LIPITOR) 20 MG tablet Take 1 tablet by mouth  Daily with supper. (Patient taking differently: Take 20 mg by mouth daily at 6 PM. ) 90 tablet 2  . BAYER MICROLET LANCETS lancets 1 each by Other route 3 (three) times daily. Use as instructed 300 each 3  . fenofibrate 54 MG tablet TAKE 1 TABLET BY MOUTH  DAILY 90 tablet 1  . glucose blood (BAYER CONTOUR NEXT TEST) test strip 1 each by Other route 3 (three) times daily. Use as instructed 300 each 3  . Insulin Glargine (TOUJEO SOLOSTAR) 300 UNIT/ML SOPN Inject 45 Units into the skin daily. Stop Humalog for now. 3 mL 1  . Insulin Pen Needle (AURORA PEN NEEDLES) 31G X 8 MM MISC Use once  daily as directed 100 each 5  . levothyroxine (SYNTHROID, LEVOTHROID) 100 MCG tablet TAKE 1 TABLET BY MOUTH  DAILY 90 tablet 1  . Liraglutide (VICTOZA) 18 MG/3ML SOPN Inject 1.2 mg into the skin daily. (Patient taking differently: Inject 1.2 mg into the skin daily. ) 18 mL 3  . metFORMIN (GLUCOPHAGE) 1000 MG tablet TAKE 1 TABLET BY MOUTH  TWICE A DAY WITH MEALS 180 tablet 0  . metoprolol  succinate (TOPROL-XL) 50 MG 24 hr tablet Take 1 tablet (50 mg total) by mouth daily. 90 tablet 1  . sertraline (ZOLOFT) 100 MG tablet TAKE 1 TABLET BY MOUTH  DAILY 90 tablet 1  . sulindac (CLINORIL) 150 MG tablet Take 150 mg by mouth 2 (two) times daily.  1  . triamcinolone cream (KENALOG) 0.1 % Apply 1 application topically 2 (two) times daily as needed. (Patient taking differently: Apply 1 application topically 2 (two) times daily as needed (for itching). ) 45 g 1  . valsartan-hydrochlorothiazide (DIOVAN-HCT) 160-25 MG tablet Take 0.5 tablets by mouth daily. Take one-half tablet by  mouth daily 45 tablet 2   No current facility-administered medications on file prior to visit.      Past Medical History:  Diagnosis Date  . Depression   . Diabetes mellitus type 2, uncontrolled (HCC) 2008  . Hyperlipidemia   . Hypertension   . Hypothyroidism   . Nephrolithiasis     No Known Allergies  Family History  Problem Relation Age of Onset  . Coronary artery disease Father   . Coronary artery disease Brother   . Breast cancer Daughter   . Cancer Daughter     breast    Social History   Social History  . Marital status: Single    Spouse name: N/A  . Number of children: N/A  . Years of education: N/A   Social History Main Topics  . Smoking status: Never Smoker  . Smokeless tobacco: Never Used  . Alcohol use No  . Drug use: No  . Sexual activity: Not Asked   Other Topics Concern  . None   Social History Narrative   Patient is divorced, she was married twice in the past. She has a son age 142, daughter age 68 who lives in [place], daughter age 68 that lives in [place]   Patient moved to [place] area from [place] 2008     Vitals:   06/06/16 1528 06/06/16 1622  BP: 140/78 135/75  Pulse: 88   Resp: 12    Body mass index is 30 kg/m.  O2 sat 98% at RA.  Wt Readings from Last 3 Encounters:  06/06/16 180 lb 4 oz (81.8 kg)  11/30/15 179 lb 6 oz (81.4 kg)  11/12/15 178  lb (80.7 kg)    Physical Exam  Nursing note and vitals reviewed. Constitutional: She is oriented to person, place, and time. She appears well-developed. No distress.  HENT:  Head: Atraumatic.  Right Ear: Hearing and external ear normal.  Left Ear: Hearing and external ear normal.  Mouth/Throat: Uvula is midline, oropharynx is clear and moist and mucous membranes are normal.  Eyes: Conjunctivae and EOM are normal. Pupils are equal, round, and reactive to light.  Neck: No JVD present. No tracheal deviation present. No thyroid mass present. Thyromegaly: palpable.  Cardiovascular: Normal rate and regular rhythm.   No murmur heard. Pulses:      Dorsalis pedis pulses are 2+ on the right side, and 2+ on the left side.  Posterior tibial pulses are 2+ on the right side, and 2+ on the left side.  Respiratory: Effort normal and breath sounds normal. No respiratory distress.  GI: Soft. She exhibits no mass. There is no hepatomegaly. There is no tenderness.  Genitourinary: No breast swelling or tenderness.  Genitourinary Comments: Breast:Fibrocystic changes outer upper quadrant L>R.No masses or nipple discharge.  Musculoskeletal: She exhibits no edema or tenderness.  No major deformity or signs of synovitis appreciated.  Lymphadenopathy:    She has no cervical adenopathy.    She has no axillary adenopathy.       Right: No supraclavicular adenopathy present.       Left: No supraclavicular adenopathy present.  Neurological: She is alert and oriented to person, place, and time. She has normal strength. No cranial nerve deficit. Coordination and gait normal.  Reflex Scores:      Bicep reflexes are 2+ on the right side and 2+ on the left side.      Patellar reflexes are 2+ on the right side and 2+ on the left side. Skin: Skin is warm. No rash noted. No erythema.  Psychiatric: She has a normal mood and affect. Her speech is normal.  Well groomed, good eye contact.      ASSESSMENT AND  PLAN:   Dawn Huang was seen today for annual exam.  Diagnoses and all orders for this visit:    Chemistry      Component Value Date/Time   NA 140 06/06/2016 1636   K 4.2 06/06/2016 1636   CL 106 06/06/2016 1636   CO2 25 06/06/2016 1636   BUN 21 06/06/2016 1636   CREATININE 1.21 (H) 06/06/2016 1636      Component Value Date/Time   CALCIUM 9.6 06/06/2016 1636   ALKPHOS 48 06/06/2016 1636   AST 14 06/06/2016 1636   ALT 14 06/06/2016 1636   BILITOT 0.7 06/06/2016 1636     Lab Results  Component Value Date   HGBA1C 6.6 (H) 06/06/2016   Lab Results  Component Value Date   MICROALBUR 1.1 06/06/2016   Lab Results  Component Value Date   TSH 3.24 06/06/2016    Routine general medical examination at a health care facility   We discussed the importance of regular physical activity and healthy diet for prevention of chronic illness and/or complications. Preventive guidelines reviewed.Plannin gon mammogram through work. She wants to find out about pap smear coverage with her health insurance but given the fact she has not had one in 20+ years, I recommend considering it. Fall prevention. Living will and POA recommended,planning on doing so. Vaccination up to date.Reporting Zoster vaccine and influenza vaccine given elsewhere. Aspirin for primary prevention discussed and continue. Ca++ and vit D supplementation recommended. Next CPE in 1 year.    -     Hepatitis C antibody screen  Essential hypertension  Otherwise adequately controlled,home BP's reported 120's/60's. No changes in current management. DASH diet recommended. Eye exam recommended annually. F/U in 6 months, before if needed.  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 HgA1C  pending. Regular exercise and healthy diet with avoidance of added sugar food intake is an important part of treatment and recommended. Annual eye exam, periodic dental and foot care recommended. F/U in 5-6 months  -     Hemoglobin  A1c -     Comprehensive metabolic panel -     Microalbumin / creatinine urine ratio  Hypothyroidism, unspecified type  No changes in current management, will follow labs done today and will give further recommendations accordingly. F/U in 6-12 months.  -     TSH -     T4, free  Asymptomatic postmenopausal estrogen deficiency -     DG Bone Density; Future     Return in 6 months (on 12/04/2016) for dm,HTN,HLD.        Betty G. [place], MD  [facility]. [place] office.

## 2016-06-06 NOTE — Patient Instructions (Addendum)
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 Healthy diet low in saturated (animal) fats and sweets and consisting of fresh fruits and vegetables, lean meats such as fish and white chicken and whole grains.   - Vaccines:  Tdap vaccine every 10 years.  Shingles vaccine recommended at age 68, could be given after 68 years of age but not sure about insurance coverage.  Pneumonia vaccines:  Prevnar 13 at 65 and Pneumovax at 66.  Screening recommendations for low/normal risk women:  Screening for diabetes at age 68-45 and every 3 years.  Cervical cancer prevention:  -HPV vaccination between 769-68 years old. -Pap smear starts at 68 years of age and continues periodically until 68 years old in low risk women. Pap smear every 3 years between 2121 and 68 years old. Pap smear every 3 years between women 30 and older if pap smear negative and HPV screening negative.  Consider having one.    -Breast cancer: Mammogram: There is disagreement between experts about when to start screening in low risk asymptomatic female but recent recommendations are to start screening at 7240 and not later than 68 years old , every 1-2 years and after 68 yo q 2 years. Screening is recommended until 68 years old but some women can continue screening depending of healthy issues.   Colon cancer screening: starts at 68 years old until 68 years  old.  Cholesterol disorder screening at age 68 and every 3 years.     Please be sure medication list is accurate. If a new problem present, please set up appointment sooner than planned today.

## 2016-06-06 NOTE — Progress Notes (Signed)
Pre visit review using our clinic review tool, if applicable. No additional management support is needed unless otherwise documented below in the visit note. 

## 2016-06-07 LAB — COMPREHENSIVE METABOLIC PANEL
ALBUMIN: 4.7 g/dL (ref 3.5–5.2)
ALK PHOS: 48 U/L (ref 39–117)
ALT: 14 U/L (ref 0–35)
AST: 14 U/L (ref 0–37)
BILIRUBIN TOTAL: 0.7 mg/dL (ref 0.2–1.2)
BUN: 21 mg/dL (ref 6–23)
CO2: 25 mEq/L (ref 19–32)
CREATININE: 1.21 mg/dL — AB (ref 0.40–1.20)
Calcium: 9.6 mg/dL (ref 8.4–10.5)
Chloride: 106 mEq/L (ref 96–112)
GFR: 47.01 mL/min — ABNORMAL LOW (ref >60.00)
GLUCOSE: 125 mg/dL — AB (ref 70–99)
Potassium: 4.2 mEq/L (ref 3.5–5.1)
SODIUM: 140 meq/L (ref 135–145)
TOTAL PROTEIN: 7.2 g/dL (ref 6.0–8.3)

## 2016-06-07 LAB — MICROALBUMIN / CREATININE URINE RATIO
Creatinine,U: 113.3 mg/dL
MICROALB UR: 1.1 mg/dL (ref 0.0–1.9)
Microalb Creat Ratio: 1 mg/g (ref 0.0–30.0)

## 2016-06-07 LAB — HEPATITIS C ANTIBODY: HCV Ab: NEGATIVE

## 2016-06-07 LAB — TSH: TSH: 3.24 u[IU]/mL (ref 0.35–4.50)

## 2016-06-07 LAB — HEMOGLOBIN A1C: Hgb A1c MFr Bld: 6.6 % — ABNORMAL HIGH (ref 4.6–6.5)

## 2016-06-07 LAB — T4, FREE: Free T4: 0.82 ng/dL (ref 0.60–1.60)

## 2016-06-11 ENCOUNTER — Encounter: Payer: Self-pay | Admitting: Family Medicine

## 2016-06-12 ENCOUNTER — Other Ambulatory Visit: Payer: Self-pay | Admitting: Family Medicine

## 2016-06-12 DIAGNOSIS — N183 Chronic kidney disease, stage 3 unspecified: Secondary | ICD-10-CM

## 2016-06-12 DIAGNOSIS — I10 Essential (primary) hypertension: Secondary | ICD-10-CM

## 2016-06-12 NOTE — Telephone Encounter (Signed)
Please advise on what you would like her to do, she is not taking the Toujeo.

## 2016-06-14 ENCOUNTER — Other Ambulatory Visit: Payer: Self-pay | Admitting: Family Medicine

## 2016-06-14 DIAGNOSIS — N183 Chronic kidney disease, stage 3 (moderate): Principal | ICD-10-CM

## 2016-06-14 DIAGNOSIS — Z794 Long term (current) use of insulin: Principal | ICD-10-CM

## 2016-06-14 DIAGNOSIS — E1122 Type 2 diabetes mellitus with diabetic chronic kidney disease: Secondary | ICD-10-CM

## 2016-06-14 MED ORDER — LIRAGLUTIDE 18 MG/3ML ~~LOC~~ SOPN: 1.2000 mg | PEN_INJECTOR | Freq: Every day | SUBCUTANEOUS | 2 refills | Status: DC

## 2016-06-14 MED ORDER — METFORMIN HCL 1000 MG PO TABS: 1000.0000 mg | ORAL_TABLET | Freq: Two times a day (BID) | ORAL | 2 refills | Status: DC

## 2016-08-28 ENCOUNTER — Other Ambulatory Visit: Payer: Self-pay | Admitting: Family Medicine

## 2016-08-30 ENCOUNTER — Other Ambulatory Visit: Payer: Self-pay | Admitting: Family Medicine

## 2016-08-30 DIAGNOSIS — E785 Hyperlipidemia, unspecified: Secondary | ICD-10-CM

## 2016-08-30 DIAGNOSIS — I10 Essential (primary) hypertension: Secondary | ICD-10-CM

## 2016-09-01 ENCOUNTER — Other Ambulatory Visit: Payer: Self-pay | Admitting: Family Medicine

## 2016-09-01 DIAGNOSIS — F32A Depression, unspecified: Secondary | ICD-10-CM

## 2016-09-01 DIAGNOSIS — F329 Major depressive disorder, single episode, unspecified: Secondary | ICD-10-CM

## 2016-11-12 ENCOUNTER — Other Ambulatory Visit: Payer: Self-pay | Admitting: Family Medicine

## 2016-12-04 NOTE — Progress Notes (Signed)
HPI:   [place] is a 66 y.o. female, who is here today for 6 months follow up.   She was last seen on 06/06/16 for ehr CPE.  Since her last OV she has in the ER for dog bite (08/09/16)  HTN: +CKD III  She is currently on Metoprolol Succinate 50 mg daily, Valsartan-HCTZ 160-25 mg 1/2 tab daily,and Amlodipine 5 mg daily. She needs Valsartan changed because recent contamination issue with some manufactories. According to pt, [facility] does not have a different generic, so recommend changing to a different medication. Benicar is not covered.   Lab Results  Component Value Date   CREATININE 1.21 (H) 06/06/2016   BUN 21 06/06/2016   NA 140 06/06/2016   K 4.2 06/06/2016   CL 106 06/06/2016   CO2 25 06/06/2016   She has not noted gross hematuria or foam in urine.  Vit D deficiency, she is on Ergocalciferol 5000 U daily. Last 25 OH vit D 11/2015 was 29.8  DM II: BS's: Not checking. Last eye exam this year, negative for retinopathy. She is currently on Metformin 1000 mg bid and Victoza 1.2 mg daily. Denies abdominal pain, nausea,vomiting,polydypsia,polyuria,or polyphagia.   Lab Results  Component Value Date   HGBA1C 6.6 (H) 06/06/2016   Lab Results  Component Value Date   MICROALBUR 1.1 06/06/2016   Tingling on feet and sharp pain (plantar fascitis). Sometimes burnig feet sensation,mainly at night.  Requesting refills for Triamcinolone cream for hand dermatitis. She uses cream as needed for pruritic rash, currently asymptomatic.  Depression: She is currently on Sertraline 100 mg daily, which she has taken for years now. Tolerating well. She feels like medications is helping. Denies suicidal thoughts and has no concerns about this problem today. Last F/U 07/2015.   Review of Systems  Constitutional: Negative for activity change, appetite change, fatigue, fever and unexpected weight change.  HENT: Negative for mouth sores, nosebleeds and trouble  swallowing.   Eyes: Negative for redness and visual disturbance.  Respiratory: Negative for cough, shortness of breath and wheezing.   Cardiovascular: Negative for chest pain, palpitations and leg swelling.  Gastrointestinal: Negative for abdominal pain, nausea and vomiting.       Negative for changes in bowel habits.  Endocrine: Negative for polydipsia, polyphagia and polyuria.  Genitourinary: Negative for decreased urine volume, dysuria and hematuria.  Musculoskeletal: Negative for gait problem and myalgias.  Skin: Negative for rash and wound.  Neurological: Negative for syncope, weakness and headaches.  Psychiatric/Behavioral: Negative for confusion, hallucinations, sleep disturbance and suicidal ideas. The patient is nervous/anxious.       Current Outpatient Prescriptions on File Prior to Visit  Medication Sig Dispense Refill  . amLODipine (NORVASC) 5 MG tablet TAKE 1 TABLET BY MOUTH  DAILY 90 tablet 1  . atorvastatin (LIPITOR) 20 MG tablet TAKE 1 TABLET BY MOUTH  DAILY WITH SUPPER 90 tablet 1  . BAYER MICROLET LANCETS lancets 1 each by Other route 3 (three) times daily. Use as instructed 300 each 3  . fenofibrate 54 MG tablet TAKE 1 TABLET BY MOUTH  DAILY 90 tablet 3  . glucose blood (BAYER CONTOUR NEXT TEST) test strip 1 each by Other route 3 (three) times daily. Use as instructed 300 each 3  . Insulin Pen Needle (AURORA PEN NEEDLES) 31G X 8 MM MISC Use once daily as directed 100 each 5  . levothyroxine (SYNTHROID, LEVOTHROID) 100 MCG tablet TAKE 1 TABLET BY MOUTH  DAILY 90 tablet  3  . liraglutide (VICTOZA) 18 MG/3ML SOPN Inject 0.2 mLs (1.2 mg total) into the skin daily. 6 pen 2  . metFORMIN (GLUCOPHAGE) 1000 MG tablet Take 1 tablet (1,000 mg total) by mouth 2 (two) times daily with a meal. 180 tablet 2  . metoprolol succinate (TOPROL-XL) 50 MG 24 hr tablet Take 1 tablet (50 mg total) by mouth daily. 90 tablet 1  . sertraline (ZOLOFT) 100 MG tablet TAKE 1 TABLET BY MOUTH  DAILY 90  tablet 1  . sulindac (CLINORIL) 150 MG tablet Take 150 mg by mouth 2 (two) times daily.  1   No current facility-administered medications on file prior to visit.      Past Medical History:  Diagnosis Date  . Depression   . Diabetes mellitus type 2, uncontrolled (HCC) 2008  . Hyperlipidemia   . Hypertension   . Hypothyroidism   . Nephrolithiasis    No Known Allergies  Social History   Social History  . Marital status: Single    Spouse name: N/A  . Number of children: N/A  . Years of education: N/A   Social History Main Topics  . Smoking status: Never Smoker  . Smokeless tobacco: Never Used  . Alcohol use No  . Drug use: No  . Sexual activity: Not Asked   Other Topics Concern  . None   Social History Narrative   Patient is divorced, she was married twice in the past. She has a son age 64, daughter age 40 who lives in [place], daughter age 18 that lives in [place]   [place] moved to [place] area from [place] 2008    Vitals:   12/05/16 1534  BP: 130/78  Pulse: 82  Resp: 12  SpO2: 95%   Body mass index is 30.97 kg/m.   Physical Exam  Nursing note and vitals reviewed. Constitutional: She is oriented to person, place, and time. She appears well-developed. No distress.  HENT:  Head: Normocephalic and atraumatic.  Mouth/Throat: Oropharynx is clear and moist and mucous membranes are normal.  Eyes: Pupils are equal, round, and reactive to light. Conjunctivae are normal.  Cardiovascular: Normal rate and regular rhythm.   No murmur heard. Pulses:      Dorsalis pedis pulses are 2+ on the right side, and 2+ on the left side.  Respiratory: Effort normal and breath sounds normal. No respiratory distress.  GI: Soft. She exhibits no mass. There is no hepatomegaly. There is no tenderness.  Musculoskeletal: She exhibits no edema.  Lymphadenopathy:    She has no cervical adenopathy.  Neurological: She is alert and oriented to person, place, and time. She has normal  strength. Coordination and gait normal.  Skin: Skin is warm. No rash noted. No erythema.  Psychiatric: She has a normal mood and affect.  Well groomed, good eye contact.    Diabetic Foot Exam - Simple   Simple Foot Form Diabetic Foot exam was performed with the following findings:  Yes 12/05/2016  4:30 PM  Visual Inspection No deformities, no ulcerations, no other skin breakdown bilaterally:  Yes Sensation Testing Intact to touch and monofilament testing bilaterally:  Yes Pulse Check Posterior Tibialis and Dorsalis pulse intact bilaterally:  Yes Comments     ASSESSMENT AND PLAN:   Ms. Adina Puzzo was seen today for 6 months follow-up.  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 No changes in current management but mediations will be adjusted according to HgA1C results. Regular exercise and healthy diet with avoidance of added sugar food intake is an important part of treatment and recommended. Annual eye exam, periodic dental and foot care recommended. F/U in 5-6 months  -     Hemoglobin A1c  Chronic kidney disease (CKD), stage III (moderate)  Stable. Low salt diet, good BP and DM controlled. Adequate hydration. Benazepril added today.  -     benazepril (LOTENSIN) 10 MG tablet; Take 1 tablet (10 mg total) by mouth daily. -     Basic metabolic panel -     VITAMIN D 25 Hydroxy (Vit-D Deficiency, Fractures)  Essential hypertension  Adequately controlled. She does not remember trying ACEI's, agrees with take Benazepril 10 mg daily. No changes in HCTZ,Amlodipine,or Metoprolol Succinate. Monitor BP  at home. DASH diet recommended. .BP check, nurse visit in 6 weeks. F/U in 6 months, before if needed.  -     benazepril (LOTENSIN) 10 MG tablet; Take 1 tablet (10 mg total) by mouth daily. -     hydrochlorothiazide (HYDRODIURIL) 12.5 MG tablet; Take 1 tablet (12.5 mg total) by mouth daily. -     Basic metabolic panel  Vitamin D deficiency  No changes in current management, will follow labs done today and will give further recommendations accordingly.  -     VITAMIN D 25 Hydroxy (Vit-D Deficiency, Fractures)  Major depressive disorder with single episode, remission status unspecified  Stable. No changes in current management. F/U in 6-12 months.  Atopic dermatitis, unspecified type  Well controlled. No changes in current management. F/U in 12 months.  -     triamcinolone cream (KENALOG) 0.1 %; Apply 1 application topically 2 (two) times daily as needed.     -Ms. Kadience Macchi was advised to return sooner than planned today if new concerns arise.       Ajah Vanhoose G. [place], MD  [facility] [place] office.

## 2016-12-05 ENCOUNTER — Encounter: Payer: Self-pay | Admitting: Family Medicine

## 2016-12-05 ENCOUNTER — Ambulatory Visit (INDEPENDENT_AMBULATORY_CARE_PROVIDER_SITE_OTHER): Payer: 59 | Admitting: Family Medicine

## 2016-12-05 VITALS — BP 130/78 | HR 82 | Resp 12 | Ht 65.0 in | Wt 186.1 lb

## 2016-12-05 DIAGNOSIS — E559 Vitamin D deficiency, unspecified: Secondary | ICD-10-CM

## 2016-12-05 DIAGNOSIS — Z794 Long term (current) use of insulin: Secondary | ICD-10-CM

## 2016-12-05 DIAGNOSIS — E1165 Type 2 diabetes mellitus with hyperglycemia: Secondary | ICD-10-CM

## 2016-12-05 DIAGNOSIS — F329 Major depressive disorder, single episode, unspecified: Secondary | ICD-10-CM | POA: Diagnosis not present

## 2016-12-05 DIAGNOSIS — I10 Essential (primary) hypertension: Secondary | ICD-10-CM

## 2016-12-05 DIAGNOSIS — N183 Chronic kidney disease, stage 3 unspecified: Secondary | ICD-10-CM

## 2016-12-05 DIAGNOSIS — L209 Atopic dermatitis, unspecified: Secondary | ICD-10-CM

## 2016-12-05 DIAGNOSIS — IMO0002 Reserved for concepts with insufficient information to code with codable children: Secondary | ICD-10-CM

## 2016-12-05 DIAGNOSIS — E1149 Type 2 diabetes mellitus with other diabetic neurological complication: Secondary | ICD-10-CM | POA: Diagnosis not present

## 2016-12-05 MED ORDER — HYDROCHLOROTHIAZIDE 12.5 MG PO TABS: 12.5000 mg | ORAL_TABLET | Freq: Every day | ORAL | 3 refills | Status: DC

## 2016-12-05 MED ORDER — BENAZEPRIL HCL 10 MG PO TABS: 10.0000 mg | ORAL_TABLET | Freq: Every day | ORAL | 3 refills | Status: DC

## 2016-12-05 MED ORDER — TRIAMCINOLONE ACETONIDE 0.1 % EX CREA: 1.0000 "application " | TOPICAL_CREAM | Freq: Two times a day (BID) | CUTANEOUS | 1 refills | Status: DC | PRN

## 2016-12-05 NOTE — Patient Instructions (Signed)
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 Benazepril stared.   Blood pressure goal for most people is less than 140/90. Some populations (older than 60) the goal is less than 150/90.  Most recent cardiologists' recommendations recommend blood pressure at or less than 130/80.   Elevated blood pressure increases the risk of strokes, heart and kidney disease, and eye problems. Regular physical activity and a healthy diet (DASH diet) usually help. Low salt diet. Take medications as instructed.  Caution with some over the counter medications as cold medications, dietary products (for weight loss), and Ibuprofen or Aleve (frequent use);all these medications could cause elevation of blood pressure.   Please be sure medication list is accurate. If a new problem present, please set up appointment sooner than planned today.

## 2016-12-06 LAB — BASIC METABOLIC PANEL
BUN: 19 mg/dL (ref 6–23)
CALCIUM: 9.7 mg/dL (ref 8.4–10.5)
CO2: 29 mEq/L (ref 19–32)
CREATININE: 1.26 mg/dL — AB (ref 0.40–1.20)
Chloride: 102 mEq/L (ref 96–112)
GFR: 44.8 mL/min — AB (ref >60.00)
Glucose, Bld: 169 mg/dL — ABNORMAL HIGH (ref 70–99)
Potassium: 3.6 mEq/L (ref 3.5–5.1)
SODIUM: 141 meq/L (ref 135–145)

## 2016-12-06 LAB — HEMOGLOBIN A1C: Hgb A1c MFr Bld: 6.9 % — ABNORMAL HIGH (ref 4.6–6.5)

## 2016-12-06 LAB — VITAMIN D 25 HYDROXY (VIT D DEFICIENCY, FRACTURES): VITD: 36.46 ng/mL (ref 30.00–100.00)

## 2016-12-07 ENCOUNTER — Encounter: Payer: Self-pay | Admitting: Family Medicine

## 2017-01-04 ENCOUNTER — Encounter: Payer: Self-pay | Admitting: Family Medicine

## 2017-02-12 ENCOUNTER — Other Ambulatory Visit: Payer: Self-pay | Admitting: Family Medicine

## 2017-02-12 DIAGNOSIS — E785 Hyperlipidemia, unspecified: Secondary | ICD-10-CM

## 2017-02-12 DIAGNOSIS — I10 Essential (primary) hypertension: Secondary | ICD-10-CM

## 2017-02-16 ENCOUNTER — Other Ambulatory Visit: Payer: Self-pay | Admitting: Family Medicine

## 2017-02-16 DIAGNOSIS — F32A Depression, unspecified: Secondary | ICD-10-CM

## 2017-02-16 DIAGNOSIS — F329 Major depressive disorder, single episode, unspecified: Secondary | ICD-10-CM

## 2017-02-19 MED ORDER — SERTRALINE HCL 100 MG PO TABS: 100.0000 mg | ORAL_TABLET | Freq: Every day | ORAL | 2 refills | Status: DC

## 2017-02-19 NOTE — Telephone Encounter (Signed)
Rx sent to pharmacy   

## 2017-02-19 NOTE — Addendum Note (Signed)
Addended by: Jobe GibbonJONES, Sina Lucchesi S on: 02/19/2017 01:53 PM   Modules accepted: Orders

## 2017-02-28 ENCOUNTER — Encounter: Payer: Self-pay | Admitting: Family Medicine

## 2017-04-16 ENCOUNTER — Other Ambulatory Visit: Payer: Self-pay | Admitting: Family Medicine

## 2017-04-16 DIAGNOSIS — E1122 Type 2 diabetes mellitus with diabetic chronic kidney disease: Secondary | ICD-10-CM

## 2017-04-16 DIAGNOSIS — Z794 Long term (current) use of insulin: Principal | ICD-10-CM

## 2017-04-16 DIAGNOSIS — N183 Chronic kidney disease, stage 3 unspecified: Secondary | ICD-10-CM

## 2017-05-12 ENCOUNTER — Other Ambulatory Visit: Payer: Self-pay | Admitting: Family Medicine

## 2017-05-12 DIAGNOSIS — N183 Chronic kidney disease, stage 3 unspecified: Secondary | ICD-10-CM

## 2017-05-12 DIAGNOSIS — E1122 Type 2 diabetes mellitus with diabetic chronic kidney disease: Secondary | ICD-10-CM

## 2017-05-12 DIAGNOSIS — Z794 Long term (current) use of insulin: Principal | ICD-10-CM

## 2017-05-15 NOTE — Telephone Encounter (Signed)
Called Patient and left a voicemail regarding Valsartan to see if the patient still taking Valsartan. Dr. Swazil[place]Jordan change patient Valsartan to Benazepril due to possible contamination issues.

## 2017-05-17 NOTE — Telephone Encounter (Signed)
Spoke with patient, patient is no longer taking Valsartan. Patient has scheduled to come in on Monday, 05/21/2017 for diabetes check.

## 2017-05-21 ENCOUNTER — Ambulatory Visit: Payer: 59 | Admitting: Family Medicine

## 2017-05-21 ENCOUNTER — Encounter: Payer: Self-pay | Admitting: Family Medicine

## 2017-05-21 VITALS — BP 125/77 | HR 76 | Temp 98.2°F | Resp 12 | Ht 65.0 in | Wt 186.4 lb

## 2017-05-21 DIAGNOSIS — I10 Essential (primary) hypertension: Secondary | ICD-10-CM | POA: Diagnosis not present

## 2017-05-21 DIAGNOSIS — N183 Chronic kidney disease, stage 3 unspecified: Secondary | ICD-10-CM

## 2017-05-21 DIAGNOSIS — E1165 Type 2 diabetes mellitus with hyperglycemia: Secondary | ICD-10-CM

## 2017-05-21 DIAGNOSIS — E039 Hypothyroidism, unspecified: Secondary | ICD-10-CM | POA: Diagnosis not present

## 2017-05-21 DIAGNOSIS — IMO0002 Reserved for concepts with insufficient information to code with codable children: Secondary | ICD-10-CM

## 2017-05-21 DIAGNOSIS — L209 Atopic dermatitis, unspecified: Secondary | ICD-10-CM | POA: Diagnosis not present

## 2017-05-21 DIAGNOSIS — E1149 Type 2 diabetes mellitus with other diabetic neurological complication: Secondary | ICD-10-CM | POA: Diagnosis not present

## 2017-05-21 MED ORDER — TRIAMCINOLONE ACETONIDE 0.1 % EX CREA: 1.0000 "application " | TOPICAL_CREAM | Freq: Two times a day (BID) | CUTANEOUS | 2 refills | Status: DC | PRN

## 2017-05-21 NOTE — Patient Instructions (Signed)
A few things to remember from today's visit:   Chronic kidney disease (CKD), stage III (moderate) (HCC)  Atopic dermatitis, unspecified type - Plan: triamcinolone cream (KENALOG) 0.1 %  Essential hypertension  Type II diabetes mellitus with neurological manifestations, uncontrolled (HCC)  To preserve kidney function and/or slow progression of disease:  Low salt diet and adequate hydration. Avoiding medicines known as "nonsteroidal anti-inflammatory drugs," or NSAIDs. These medicines include ibuprofen (sample brand names: Advil, Motrin) and naproxen (sample brand name: Aleve). Sulindac. Adequate blood pressure control. Low phosphorus diet.    Please be sure medication list is accurate. If a new problem present, please set up appointment sooner than planned today.

## 2017-05-21 NOTE — Progress Notes (Signed)
HPI:   [place] is a 67 y.o. female, who is here today for 5 months follow up.   She was last seen on 12/05/16.   Diabetes Mellitus II:   Dx 5+ years ago. Currently on Victoza 1.2 mg daily, Metformin 1000 mg bid,. Last eye exam: A year ago. Checking BS's : Not checking.  Hypoglycemia:No symptoms.  She is  is tolerating medications well. She denies abdominal pain, nausea, vomiting, polydipsia, polyuria, or polyphagia. Stable tingling RUE.    Lab Results  Component Value Date   CREATININE 1.26 (H) 12/05/2016   BUN 19 12/05/2016   NA 141 12/05/2016   K 3.6 12/05/2016   CL 102 12/05/2016   CO2 29 12/05/2016    Lab Results  Component Value Date   HGBA1C 6.9 (H) 12/05/2016   Lab Results  Component Value Date   MICROALBUR 1.1 06/06/2016   She has not been consistent with a healthy diet or regular physical activity.    Hypothyroidism:  Currently she is on Levothyroxine 100 mcg daily. Tolerating medication well, no side effects reported. She has not noted dysphagia, palpitations, changes in bowel habits, tremor, cold/heat intolerance, or abnormal weight loss.  Lab Results  Component Value Date   TSH 3.24 06/06/2016   HTN: She is not sure about all medications she is taking. ? HCTZ and Benazepril.  Denies  headache, visual changes, chest pain, dyspnea, claudication, focal weakness, or edema.  CKD III, she takes Silindac sometimes (NSAID's) for joint pain. She has not noted gross hematuria,foam in urine ,or decreased urine output.  Eczema: It is worse with cold weather. Currently she is on Triamcinolone 0.1%, which helps with acute rash. She has no symptoms at this time.   Review of Systems  Constitutional: Negative for activity change, appetite change, fatigue, fever and unexpected weight change.  HENT: Negative for mouth sores, nosebleeds and trouble swallowing.   Eyes: Negative for redness and visual disturbance.  Respiratory: Negative  for cough, shortness of breath and wheezing.   Cardiovascular: Negative for chest pain, palpitations and leg swelling.  Gastrointestinal: Negative for abdominal pain, nausea and vomiting.       Negative for changes in bowel habits.  Endocrine: Negative for cold intolerance, heat intolerance, polydipsia, polyphagia and polyuria.  Genitourinary: Negative for decreased urine volume, difficulty urinating, dysuria and hematuria.  Musculoskeletal: Negative for gait problem and myalgias.  Skin: Negative for rash and wound.  Neurological: Negative for syncope, weakness and headaches.  Psychiatric/Behavioral: Negative for confusion. The patient is not nervous/anxious.      Current Outpatient Medications on File Prior to Visit  Medication Sig Dispense Refill  . amLODipine (NORVASC) 5 MG tablet TAKE 1 TABLET BY MOUTH  DAILY 90 tablet 1  . atorvastatin (LIPITOR) 20 MG tablet TAKE 1 TABLET BY MOUTH  DAILY WITH SUPPER 90 tablet 1  . BAYER MICROLET LANCETS lancets 1 each by Other route 3 (three) times daily. Use as instructed 300 each 3  . benazepril (LOTENSIN) 10 MG tablet Take 1 tablet (10 mg total) by mouth daily. 30 tablet 3  . fenofibrate 54 MG tablet TAKE 1 TABLET BY MOUTH  DAILY 90 tablet 3  . glucose blood (BAYER CONTOUR NEXT TEST) test strip 1 each by Other route 3 (three) times daily. Use as instructed 300 each 3  . hydrochlorothiazide (HYDRODIURIL) 12.5 MG tablet Take 1 tablet (12.5 mg total) by mouth daily. 30 tablet 3  . Insulin Pen Needle (AURORA PEN NEEDLES) 31G  X 8 MM MISC Use once daily as directed 100 each 5  . levothyroxine (SYNTHROID, LEVOTHROID) 100 MCG tablet TAKE 1 TABLET BY MOUTH  DAILY 90 tablet 3  . metFORMIN (GLUCOPHAGE) 1000 MG tablet TAKE 1 TABLET BY MOUTH TWO  TIMES DAILY WITH MEAL 180 tablet 2  . metoprolol succinate (TOPROL-XL) 50 MG 24 hr tablet TAKE 1 TABLET BY MOUTH  DAILY 90 tablet 1  . sertraline (ZOLOFT) 100 MG tablet Take 1 tablet (100 mg total) daily by mouth. 90  tablet 2  . sulindac (CLINORIL) 150 MG tablet Take 150 mg by mouth 2 (two) times daily.  1  . VICTOZA 18 MG/3ML SOPN INJECT 0.2 MLS (1.2 MG  TOTAL) INTO THE SKIN DAILY. 18 mL 0   No current facility-administered medications on file prior to visit.      Past Medical History:  Diagnosis Date  . Depression   . Diabetes mellitus type 2, uncontrolled (HCC) 2008  . Hyperlipidemia   . Hypertension   . Hypothyroidism   . Nephrolithiasis    No Known Allergies  Social History   Socioeconomic History  . Marital status: Single    Spouse name: None  . Number of children: None  . Years of education: None  . Highest education level: None  Social Needs  . Financial resource strain: None  . Food insecurity - worry: None  . Food insecurity - inability: None  . Transportation needs - medical: None  . Transportation needs - non-medical: None  Occupational History  . None  Tobacco Use  . Smoking status: Never Smoker  . Smokeless tobacco: Never Used  Substance and Sexual Activity  . Alcohol use: No    Alcohol/week: 0.0 oz  . Drug use: No  . Sexual activity: None  Other Topics Concern  . None  Social History Narrative   Patient is divorced, she was married twice in the past. She has a son age 142, daughter age 69 who lives in [place], daughter age 69 that lives in [place]   Patient moved to [place] area from [place] 2008    Vitals:   05/21/17 1547  BP: 125/77  Pulse: 76  Resp: 12  Temp: 98.2 F (36.8 C)  SpO2: 97%   Body mass index is 31.01 kg/m.    Physical Exam  Nursing note and vitals reviewed. Constitutional: She is oriented to person, place, and time. She appears well-developed. No distress.  HENT:  Head: Normocephalic and atraumatic.  Mouth/Throat: Oropharynx is clear and moist and mucous membranes are normal.  Eyes: Conjunctivae are normal. Pupils are equal, round, and reactive to light.  Neck: No tracheal deviation present. No thyroid mass and no  thyromegaly (palpable) present.  Cardiovascular: Normal rate and regular rhythm.  No murmur heard. Pulses:      Dorsalis pedis pulses are 2+ on the right side, and 2+ on the left side.  Respiratory: Effort normal and breath sounds normal. No respiratory distress.  GI: Soft. She exhibits no mass. There is no hepatomegaly. There is no tenderness.  Musculoskeletal: She exhibits no edema.  Lymphadenopathy:    She has no cervical adenopathy.  Neurological: She is alert and oriented to person, place, and time. She has normal strength. Gait normal.  Skin: Skin is warm. No erythema.  Psychiatric: She has a normal mood and affect.  Well groomed, good eye contact.    Foot exam 11/2016     ASSESSMENT AND PLAN:   Ms. Osie BondSharon Cozart was seen today for  5 months follow-up.  Orders Placed This Encounter  Procedures  . Basic metabolic panel  . TSH  . Hemoglobin A1c  . Microalbumin / creatinine urine ratio    Hypertension Adequately controlled. No changes in current management. DASH - low salt diet to continue. Eye exam recommended annually. F/U in 6 months, before if needed.   Type II diabetes mellitus with neurological manifestations, uncontrolled (HCC) HgA1C at goal. No changes in current management. Regular exercise and healthy diet with avoidance of added sugar food intake is an important part of treatment and recommended. Annual eye exam, periodic dental and foot care recommended. F/U in 5-6 months   Chronic kidney disease (CKD), stage III (moderate) (HCC) Avoid NSAID's as Sulindac. Low salt diet and adequate BP/DM controlled. She is not sure is she is taking Benazepril  Hypothyroidism No changes in current management, will follow labs done today and will give further recommendations accordingly. F/U in 6-12 months.       -Ms. Cashmere Dingley was advised to return sooner than planned today if new concerns arise.       Jenisa Monty G. [place], MD  [facility]. [place] office.

## 2017-05-21 NOTE — Assessment & Plan Note (Signed)
HgA1C at goal. No changes in current management. Regular exercise and healthy diet with avoidance of added sugar food intake is an important part of treatment and recommended. Annual eye exam, periodic dental and foot care recommended. F/U in 5-6 months  

## 2017-05-21 NOTE — Assessment & Plan Note (Signed)
Adequately controlled. No changes in current management. DASH-low salt diet to continue. Eye exam recommended annually. F/U in 6 months, before if needed.  

## 2017-05-21 NOTE — Assessment & Plan Note (Signed)
Avoid NSAID's as Sulindac. Low salt diet and adequate BP/DM controlled. She is not sure is she is taking Benazepril

## 2017-05-21 NOTE — Assessment & Plan Note (Signed)
No changes in current management, will follow labs done today and will give further recommendations accordingly. F/U in 6-12 months.  

## 2017-05-22 LAB — BASIC METABOLIC PANEL
BUN: 20 mg/dL (ref 6–23)
CO2: 26 mEq/L (ref 19–32)
CREATININE: 1.26 mg/dL — AB (ref 0.40–1.20)
Calcium: 9.2 mg/dL (ref 8.4–10.5)
Chloride: 101 mEq/L (ref 96–112)
GFR: 44.74 mL/min — AB (ref >60.00)
Glucose, Bld: 208 mg/dL — ABNORMAL HIGH (ref 70–99)
Potassium: 3.7 mEq/L (ref 3.5–5.1)
Sodium: 139 mEq/L (ref 135–145)

## 2017-05-22 LAB — TSH: TSH: 2.75 u[IU]/mL (ref 0.35–4.50)

## 2017-05-22 LAB — MICROALBUMIN / CREATININE URINE RATIO
Creatinine,U: 75 mg/dL
Microalb Creat Ratio: 1.5 mg/g (ref 0.0–30.0)
Microalb, Ur: 1.1 mg/dL (ref 0.0–1.9)

## 2017-05-22 LAB — HEMOGLOBIN A1C: HEMOGLOBIN A1C: 7.2 % — AB (ref 4.6–6.5)

## 2017-05-24 ENCOUNTER — Encounter: Payer: Self-pay | Admitting: Family Medicine

## 2017-07-21 ENCOUNTER — Other Ambulatory Visit: Payer: Self-pay | Admitting: Family Medicine

## 2017-07-21 DIAGNOSIS — E785 Hyperlipidemia, unspecified: Secondary | ICD-10-CM

## 2017-07-21 DIAGNOSIS — I10 Essential (primary) hypertension: Secondary | ICD-10-CM

## 2017-07-23 ENCOUNTER — Other Ambulatory Visit: Payer: Self-pay | Admitting: Family Medicine

## 2017-07-27 ENCOUNTER — Other Ambulatory Visit: Payer: Self-pay | Admitting: Family Medicine

## 2017-07-27 ENCOUNTER — Other Ambulatory Visit: Payer: Self-pay | Admitting: *Deleted

## 2017-07-27 DIAGNOSIS — N183 Chronic kidney disease, stage 3 unspecified: Secondary | ICD-10-CM

## 2017-07-27 DIAGNOSIS — E1122 Type 2 diabetes mellitus with diabetic chronic kidney disease: Secondary | ICD-10-CM

## 2017-07-27 DIAGNOSIS — Z794 Long term (current) use of insulin: Principal | ICD-10-CM

## 2017-07-27 MED ORDER — FENOFIBRATE 54 MG PO TABS: 54.0000 mg | ORAL_TABLET | Freq: Every day | ORAL | 0 refills | Status: DC

## 2017-07-27 NOTE — Telephone Encounter (Signed)
Rx done. 

## 2017-10-05 ENCOUNTER — Other Ambulatory Visit: Payer: Self-pay | Admitting: Family Medicine

## 2017-10-06 ENCOUNTER — Other Ambulatory Visit: Payer: Self-pay | Admitting: Family Medicine

## 2017-10-08 NOTE — Telephone Encounter (Signed)
Pt following up on this request for  fenofibrate 54 MG tablet  Last refill 07/27/17, only a 90 day.  [facility]  [facility] [place] [id number] [place] [phone] (Phone) [phone] (Fax)    Pt not due for 6 mo fu until August

## 2017-10-09 ENCOUNTER — Other Ambulatory Visit: Payer: Self-pay | Admitting: Family Medicine

## 2017-10-09 DIAGNOSIS — E1122 Type 2 diabetes mellitus with diabetic chronic kidney disease: Secondary | ICD-10-CM

## 2017-10-09 DIAGNOSIS — Z794 Long term (current) use of insulin: Principal | ICD-10-CM

## 2017-10-09 DIAGNOSIS — N183 Chronic kidney disease, stage 3 (moderate): Principal | ICD-10-CM

## 2017-10-17 NOTE — Telephone Encounter (Signed)
Resending request to Dr. [place]il[place]Jordan for approval.

## 2017-10-17 NOTE — Telephone Encounter (Signed)
2752-month supply for fenofibrate was sent to her pharmacy in 07/2017. I noted that she was due for follow-up in 10/2017 (5-6 months f/u), so I was planning on refilling medication during follow-up appointment in 10/2017.  Thanks, BJ

## 2017-10-18 ENCOUNTER — Other Ambulatory Visit: Payer: Self-pay | Admitting: Family Medicine

## 2017-10-18 DIAGNOSIS — F32A Depression, unspecified: Secondary | ICD-10-CM

## 2017-10-18 DIAGNOSIS — F329 Major depressive disorder, single episode, unspecified: Secondary | ICD-10-CM

## 2017-10-19 NOTE — Telephone Encounter (Signed)
Tried calling patient, unable to leave message, mailbox full. 

## 2017-10-22 NOTE — Telephone Encounter (Signed)
Left message for patient to call office to schedule follow up appointment.

## 2017-12-04 NOTE — Progress Notes (Signed)
HPI:  Chief Complaint  Patient presents with  . Diabetes Follow-up    Ms.Danasha Melman is a 67 y.o. female, who is here today for 6 months follow up + a few concerns.  Sje was last seen in 05/2017.  Diabetes Mellitus II:   Currently on Victoza 1.8 mg daily and Metformin 1000 mgbid. Checking BS's : Not checking. Hypoglycemia: Neg  She is tolerating medications well. She denies abdominal pain, nausea, vomiting, polydipsia, polyuria, or polyphagia. No numbness, tingling, or burning.   Lab Results  Component Value Date   HGBA1C 7.2 (H) 05/21/2017   Lab Results  Component Value Date   MICROALBUR 1.1 05/21/2017   She states that she is still eating some sweets but has decreased amount. She is also eating fruit, mainly watermelon.    Hypertension:  Currently on Metoprolol Succinate 50 mg daily ,benazepril 10 mg,and HCTZ 25 mg.  Yesterday 164/86 at work,she is not checking it at home.  She is taking medications as instructed, no side effects reported.  She has not noted unusual headache, visual changes, exertional chest pain, dyspnea,  focal weakness, or edema.   Lab Results  Component Value Date   CREATININE 1.26 (H) 05/21/2017   BUN 20 05/21/2017   NA 139 05/21/2017   K 3.7 05/21/2017   CL 101 05/21/2017   CO2 26 05/21/2017   HLD: On Fenofibrate 54 mg daily and Atorvastatin 20 mg daily. She is trying to follow a low-fat diet. She is not reporting side effects from medication.  Lab Results  Component Value Date   CHOL 148 07/28/2015   HDL 55.00 07/28/2015   LDLCALC 67 07/28/2015   LDLDIRECT 100.5 09/16/2012   TRIG 130.0 07/28/2015   CHOLHDL 3 07/28/2015     A couple weeks of sinus pressure, sneezing ,cough. Sometimes cough is productive with whitish sputum. No fever, chills, body aches, or decreased appetite. No sick contact. She has not try OTC medication.   Sleeping is "messed up." Problem started about a months ago. She thinks it could be  related to the death of her dog about a month ago.  No OTC treatment.  Depression:  She does not feel like Sertraline 100 mg is helping, which she has "taken for ever."  She denies suicidal thoughts.  Her daughter died from breast cancer about 2 years ago, she still feels sad and depressed when she thinks about her.   Plantar pain for a few weeks, improved with stretching exercises. No local edema or erythema. No history of trauma. Pain is worse first thing in the morning and gets better after a few steps.   Bilateral knee pain, exacerbated by walking stairs. She thinks this was exacerbated by plantar fasciitis, she was walking "funny."  She has had problems intermittently for years. No edema or erythema. No limitation of movement.   Review of Systems  Constitutional: Positive for fatigue. Negative for activity change, appetite change and fever.  HENT: Positive for congestion, postnasal drip and sinus pressure. Negative for mouth sores, nosebleeds, sore throat and trouble swallowing.   Eyes: Negative for redness and visual disturbance.  Respiratory: Negative for cough, shortness of breath and wheezing.   Cardiovascular: Negative for chest pain, palpitations and leg swelling.  Gastrointestinal: Negative for abdominal pain, nausea and vomiting.       Negative for changes in bowel habits.  Genitourinary: Negative for decreased urine volume and hematuria.  Musculoskeletal: Positive for arthralgias. Negative for gait problem and joint swelling.  Neurological: Positive for headaches. Negative for syncope, weakness and numbness.  Psychiatric/Behavioral: Positive for sleep disturbance. Negative for confusion. The patient is nervous/anxious.       Current Outpatient Medications on File Prior to Visit  Medication Sig Dispense Refill  . amLODipine (NORVASC) 5 MG tablet TAKE 1 TABLET BY MOUTH  DAILY 90 tablet 1  . atorvastatin (LIPITOR) 20 MG tablet TAKE 1 TABLET BY MOUTH  DAILY  WITH SUPPER 90 tablet 1  . BAYER MICROLET LANCETS lancets 1 each by Other route 3 (three) times daily. Use as instructed 300 each 3  . benazepril (LOTENSIN) 10 MG tablet Take 1 tablet (10 mg total) by mouth daily. 30 tablet 3  . ergocalciferol (VITAMIN D2) 50000 units capsule Take 50,000 Units by mouth once a week.    . fenofibrate 54 MG tablet Take 1 tablet (54 mg total) by mouth daily. 90 tablet 0  . glucose blood (BAYER CONTOUR NEXT TEST) test strip 1 each by Other route 3 (three) times daily. Use as instructed 300 each 3  . hydrochlorothiazide (HYDRODIURIL) 12.5 MG tablet Take 1 tablet (12.5 mg total) by mouth daily. 30 tablet 3  . Insulin Detemir (LEVEMIR FLEXTOUCH) 100 UNIT/ML Pen Levemir FlexTouch U-100 Insulin 100 unit/mL (3 mL) subcutaneous pen    . Insulin Pen Needle (AURORA PEN NEEDLES) 31G X 8 MM MISC Use once daily as directed 100 each 5  . levothyroxine (SYNTHROID, LEVOTHROID) 100 MCG tablet TAKE 1 TABLET BY MOUTH  DAILY 90 tablet 3  . metFORMIN (GLUCOPHAGE) 1000 MG tablet TAKE 1 TABLET BY MOUTH TWO  TIMES DAILY WITH MEAL 180 tablet 2  . metoprolol succinate (TOPROL-XL) 50 MG 24 hr tablet TAKE 1 TABLET BY MOUTH  DAILY 90 tablet 1  . sertraline (ZOLOFT) 100 MG tablet TAKE 1 TABLET BY MOUTH  DAILY 90 tablet 0  . triamcinolone cream (KENALOG) 0.1 % Apply 1 application topically 2 (two) times daily as needed. 45 g 2  . VICTOZA 18 MG/3ML SOPN INJECT SUBCUTANEOUSLY 0.2  MLS (1.2 MG TOTAL) DAILY. 18 mL 0   No current facility-administered medications on file prior to visit.      Past Medical History:  Diagnosis Date  . Depression   . Diabetes mellitus type 2, uncontrolled (HCC) 2008  . Hyperlipidemia   . Hypertension   . Hypothyroidism   . Nephrolithiasis    No Known Allergies  Social History   Socioeconomic History  . Marital status: Single    Spouse name: Not on file  . Number of children: Not on file  . Years of education: Not on file  . Highest education level: Not on  file  Occupational History  . Not on file  Social Needs  . Financial resource strain: Not on file  . Food insecurity:    Worry: Not on file    Inability: Not on file  . Transportation needs:    Medical: Not on file    Non-medical: Not on file  Tobacco Use  . Smoking status: Never Smoker  . Smokeless tobacco: Never Used  Substance and Sexual Activity  . Alcohol use: No    Alcohol/week: 0.0 standard drinks  . Drug use: No  . Sexual activity: Not on file  Lifestyle  . Physical activity:    Days per week: Not on file    Minutes per session: Not on file  . Stress: Not on file  Relationships  . Social connections:    Talks on phone: Not on file  Gets together: Not on file    Attends religious service: Not on file    Active member of club or organization: Not on file    Attends meetings of clubs or organizations: Not on file    Relationship status: Not on file  Other Topics Concern  . Not on file  Social History Narrative   Patient is divorced, she was married twice in the past. She has a son age 69, daughter age 69 who lives in [place], daughter age 69 that lives in [place]   Patient moved to [place] area from [place] 2008    Vitals:   12/05/17 0745  BP: 130/84  Pulse: 72  Resp: 12  Temp: 98.4 F (36.9 C)  SpO2: 98%   Body mass index is 30.31 kg/m.   Physical Exam  Nursing note and vitals reviewed. Constitutional: She is oriented to person, place, and time. She appears well-developed. No distress.  HENT:  Head: Normocephalic and atraumatic.  Mouth/Throat: Oropharynx is clear and moist and mucous membranes are normal.  Eyes: Pupils are equal, round, and reactive to light. Conjunctivae are normal.  Cardiovascular: Normal rate and regular rhythm.  No murmur heard. Pulses:      Dorsalis pedis pulses are 2+ on the right side, and 2+ on the left side.  Respiratory: Effort normal and breath sounds normal. No respiratory distress.  GI: Soft. She exhibits no  mass. There is no hepatomegaly. There is no tenderness.  Musculoskeletal: She exhibits no edema.  No tenderness upon palpation of plantar fascia, bilateral. Mild knee crepitus upon flexion and extension.  No edema or erythema and range of motion otherwise normal.  Lymphadenopathy:    She has no cervical adenopathy.  Neurological: She is alert and oriented to person, place, and time. She has normal strength. No cranial nerve deficit. Gait normal.  Skin: Skin is warm. No rash noted. No erythema.  Psychiatric: Her mood appears anxious.  Well groomed, good eye contact.       ASSESSMENT AND PLAN:   Ms. Osie BondSharon Melone was seen today for 6 months follow-up.  Orders Placed This Encounter  Procedures  . Pneumococcal polysaccharide vaccine 23-valent greater than or equal to 2yo subcutaneous/IM  . Comprehensive metabolic panel  . Lipid panel  . POCT glycosylated hemoglobin (Hb A1C)   Lab Results  Component Value Date   CHOL 177 12/05/2017   HDL 59.80 12/05/2017   LDLCALC 67 07/28/2015   LDLDIRECT 94.0 12/05/2017   TRIG 204.0 (H) 12/05/2017   CHOLHDL 3 12/05/2017   Lab Results  Component Value Date   ALT 26 12/05/2017   AST 20 12/05/2017   ALKPHOS 56 12/05/2017   BILITOT 1.0 12/05/2017   Lab Results  Component Value Date   CREATININE 1.15 12/05/2017   BUN 21 12/05/2017   NA 139 12/05/2017   K 4.7 12/05/2017   CL 103 12/05/2017   CO2 26 12/05/2017   Lab Results  Component Value Date   HGBA1C 7.5 (A) 12/05/2017     Insomnia, unspecified type  She has not try OTC medication, so recommend melatonin 3 to 5 mg daily. Adequate sleep hygiene. Follow-up in 2 months.  Chronic kidney disease (CKD), stage III (moderate) (HCC) It has been stable. Adequate BP and DM 2 controlled. Continue avoiding NSAIDs. Adequate hydration. Continue benazepril 10 mg daily.  Type II diabetes mellitus with neurological manifestations, uncontrolled (HCC) HgA1C is not at goal. After discussion  of some side effects, she agrees with adding Jardiance 10 mg  daily. No changes in Victoza or metformin. Regular exercise and healthy diet with avoidance of added sugar food intake is an important part of treatment and recommended. Annual eye exam and foot care recommended. F/U in 5-6 months   Hyperlipidemia No changes in current management, will follow labs done today and will give further recommendations accordingly. Follow-up in 6 to 12 months.  Depression Because she is not sure if sertraline is really helping, we will start decreasing medication. Recommend alternating between 150 mg every other day for 2 weeks and then continue 50 mg daily. We will consider adding a different SSRI, Celexa, or a SNRI like Effexor. Instructed about warning signs. Follow-up in 2 months.  Hypertension Here in the office adequately controlled. Recommend checking BP at home, she has a blood pressure monitor. No changes in current management. Continue low-salt diet. Follow-up in 2 months.   Sinus pressure  Most likely related to allergies. I do not think she has a bacterial sinus infection, so antibiotic is not recommended at this time. Saline nasal irrigations as needed and Flonase nasal spray daily. Follow-up as needed.  - fluticasone (FLONASE) 50 MCG/ACT nasal spray; Place 2 sprays into both nostrils daily.  Dispense: 16 g; Refill: 6   Need for 23-polyvalent pneumococcal polysaccharide vaccine - Pneumococcal polysaccharide vaccine 23-valent greater than or equal to 2yo subcutaneous/IM     Taliya Mcclard G. [place], MD  [facility]. [place] [facility].

## 2017-12-05 ENCOUNTER — Ambulatory Visit: Payer: 59 | Admitting: Family Medicine

## 2017-12-05 ENCOUNTER — Encounter: Payer: Self-pay | Admitting: Family Medicine

## 2017-12-05 VITALS — BP 130/84 | HR 72 | Temp 98.4°F | Resp 12 | Ht 65.0 in | Wt 182.1 lb

## 2017-12-05 DIAGNOSIS — Z23 Encounter for immunization: Secondary | ICD-10-CM

## 2017-12-05 DIAGNOSIS — N183 Chronic kidney disease, stage 3 unspecified: Secondary | ICD-10-CM

## 2017-12-05 DIAGNOSIS — E1165 Type 2 diabetes mellitus with hyperglycemia: Secondary | ICD-10-CM

## 2017-12-05 DIAGNOSIS — I10 Essential (primary) hypertension: Secondary | ICD-10-CM | POA: Diagnosis not present

## 2017-12-05 DIAGNOSIS — E1149 Type 2 diabetes mellitus with other diabetic neurological complication: Secondary | ICD-10-CM

## 2017-12-05 DIAGNOSIS — J3489 Other specified disorders of nose and nasal sinuses: Secondary | ICD-10-CM

## 2017-12-05 DIAGNOSIS — F329 Major depressive disorder, single episode, unspecified: Secondary | ICD-10-CM

## 2017-12-05 DIAGNOSIS — E782 Mixed hyperlipidemia: Secondary | ICD-10-CM

## 2017-12-05 DIAGNOSIS — G4700 Insomnia, unspecified: Secondary | ICD-10-CM

## 2017-12-05 DIAGNOSIS — IMO0002 Reserved for concepts with insufficient information to code with codable children: Secondary | ICD-10-CM

## 2017-12-05 LAB — LIPID PANEL
CHOLESTEROL: 177 mg/dL (ref 0–200)
HDL: 59.8 mg/dL (ref >39.00)
NonHDL: 116.98
TRIGLYCERIDES: 204 mg/dL — AB (ref 0.0–149.0)
Total CHOL/HDL Ratio: 3
VLDL: 40.8 mg/dL — ABNORMAL HIGH (ref 0.0–40.0)

## 2017-12-05 LAB — COMPREHENSIVE METABOLIC PANEL
ALBUMIN: 4.6 g/dL (ref 3.5–5.2)
ALK PHOS: 56 U/L (ref 39–117)
ALT: 26 U/L (ref 0–35)
AST: 20 U/L (ref 0–37)
BILIRUBIN TOTAL: 1 mg/dL (ref 0.2–1.2)
BUN: 21 mg/dL (ref 6–23)
CALCIUM: 10.4 mg/dL (ref 8.4–10.5)
CO2: 26 mEq/L (ref 19–32)
CREATININE: 1.15 mg/dL (ref 0.40–1.20)
Chloride: 103 mEq/L (ref 96–112)
GFR: 49.63 mL/min — AB (ref >60.00)
Glucose, Bld: 224 mg/dL — ABNORMAL HIGH (ref 70–99)
Potassium: 4.7 mEq/L (ref 3.5–5.1)
Sodium: 139 mEq/L (ref 135–145)
Total Protein: 7.5 g/dL (ref 6.0–8.3)

## 2017-12-05 LAB — POCT GLYCOSYLATED HEMOGLOBIN (HGB A1C): Hemoglobin A1C: 7.5 % — AB (ref 4.0–5.6)

## 2017-12-05 LAB — LDL CHOLESTEROL, DIRECT: Direct LDL: 94 mg/dL

## 2017-12-05 MED ORDER — EMPAGLIFLOZIN 10 MG PO TABS
10.0000 mg | ORAL_TABLET | Freq: Every day | ORAL | 4 refills | Status: DC
Start: 2017-12-05 — End: 2018-05-03

## 2017-12-05 MED ORDER — FLUTICASONE PROPIONATE 50 MCG/ACT NA SUSP: 2.0000 | Freq: Every day | NASAL | 6 refills | Status: DC

## 2017-12-05 NOTE — Assessment & Plan Note (Signed)
It has been stable. Adequate BP and DM 2 controlled. Continue avoiding NSAIDs. Adequate hydration. Continue benazepril 10 mg daily.

## 2017-12-05 NOTE — Assessment & Plan Note (Signed)
HgA1C is not at goal. After discussion of some side effects, she agrees with adding Jardiance 10 mg daily. No changes in Victoza or metformin. Regular exercise and healthy diet with avoidance of added sugar food intake is an important part of treatment and recommended. Annual eye exam and foot care recommended. F/U in 5-6 months

## 2017-12-05 NOTE — Assessment & Plan Note (Signed)
No changes in current management, will follow labs done today and will give further recommendations accordingly. Follow-up in 6 to 12 months. 

## 2017-12-05 NOTE — Assessment & Plan Note (Signed)
Because she is not sure if sertraline is really helping, we will start decreasing medication. Recommend alternating between 150 mg every other day for 2 weeks and then continue 50 mg daily. We will consider adding a different SSRI, Celexa, or a SNRI like Effexor. Instructed about warning signs. Follow-up in 2 months.

## 2017-12-05 NOTE — Patient Instructions (Addendum)
A few things to remember from today's visit:   Type II diabetes mellitus with neurological manifestations, uncontrolled (HCC) - Plan: POCT glycosylated hemoglobin (Hb A1C), Comprehensive metabolic panel, Lipid panel, empagliflozin (JARDIANCE) 10 MG TABS tablet  Chronic kidney disease (CKD), stage III (moderate) (HCC) - Plan: Comprehensive metabolic panel  Essential hypertension - Plan: Comprehensive metabolic panel  Mixed hyperlipidemia - Plan: Lipid panel  Major depressive disorder with single episode, remission status unspecified  Insomnia, unspecified type  Sinus pressure - Plan: fluticasone (FLONASE) 50 MCG/ACT nasal spray  We will start decreasing sertraline, start alternating between 150 mg every other day.  In 2 weeks you can decrease it to 50 mg.  For diabetes we are adding Jardiance 10 mg. For insomnia you can try melatonin 3 to 5 mg at bedtime. Sinus pressure is most likely caused by allergies, Flonase nasal spray daily and nasal irrigation with saline may help.  Plantar fasciitis: If symptoms persist please schedule appointment with podiatrist. Continue stretching exercises and use shoes with soft inserts.  Please be sure medication list is accurate. If a new problem present, please set up appointment sooner than planned today.

## 2017-12-05 NOTE — Assessment & Plan Note (Signed)
Here in the office adequately controlled. Recommend checking BP at home, she has a blood pressure monitor. No changes in current management. Continue low-salt diet. Follow-up in 2 months.

## 2017-12-08 ENCOUNTER — Encounter: Payer: Self-pay | Admitting: Family Medicine

## 2017-12-10 ENCOUNTER — Other Ambulatory Visit: Payer: Self-pay | Admitting: Family Medicine

## 2017-12-10 DIAGNOSIS — E785 Hyperlipidemia, unspecified: Secondary | ICD-10-CM

## 2017-12-10 DIAGNOSIS — I10 Essential (primary) hypertension: Secondary | ICD-10-CM

## 2017-12-13 ENCOUNTER — Other Ambulatory Visit: Payer: Self-pay | Admitting: Family Medicine

## 2017-12-13 DIAGNOSIS — F32A Depression, unspecified: Secondary | ICD-10-CM

## 2017-12-13 DIAGNOSIS — F329 Major depressive disorder, single episode, unspecified: Secondary | ICD-10-CM

## 2017-12-15 ENCOUNTER — Other Ambulatory Visit: Payer: Self-pay | Admitting: Family Medicine

## 2017-12-15 DIAGNOSIS — N183 Chronic kidney disease, stage 3 (moderate): Principal | ICD-10-CM

## 2017-12-15 DIAGNOSIS — E1122 Type 2 diabetes mellitus with diabetic chronic kidney disease: Secondary | ICD-10-CM

## 2017-12-15 DIAGNOSIS — Z794 Long term (current) use of insulin: Principal | ICD-10-CM

## 2017-12-20 ENCOUNTER — Other Ambulatory Visit: Payer: Self-pay | Admitting: Family Medicine

## 2017-12-20 DIAGNOSIS — Z794 Long term (current) use of insulin: Principal | ICD-10-CM

## 2017-12-20 DIAGNOSIS — E1122 Type 2 diabetes mellitus with diabetic chronic kidney disease: Secondary | ICD-10-CM

## 2017-12-20 DIAGNOSIS — N183 Chronic kidney disease, stage 3 unspecified: Secondary | ICD-10-CM

## 2018-02-14 ENCOUNTER — Other Ambulatory Visit: Payer: Self-pay | Admitting: Family Medicine

## 2018-02-14 DIAGNOSIS — Z794 Long term (current) use of insulin: Principal | ICD-10-CM

## 2018-02-14 DIAGNOSIS — E1122 Type 2 diabetes mellitus with diabetic chronic kidney disease: Secondary | ICD-10-CM

## 2018-02-14 DIAGNOSIS — N183 Chronic kidney disease, stage 3 (moderate): Principal | ICD-10-CM

## 2018-05-03 ENCOUNTER — Other Ambulatory Visit: Payer: Self-pay | Admitting: Family Medicine

## 2018-05-03 DIAGNOSIS — IMO0002 Reserved for concepts with insufficient information to code with codable children: Secondary | ICD-10-CM

## 2018-05-03 DIAGNOSIS — E1165 Type 2 diabetes mellitus with hyperglycemia: Principal | ICD-10-CM

## 2018-05-03 DIAGNOSIS — E1149 Type 2 diabetes mellitus with other diabetic neurological complication: Secondary | ICD-10-CM

## 2018-05-10 ENCOUNTER — Other Ambulatory Visit: Payer: Self-pay | Admitting: Family Medicine

## 2018-05-10 DIAGNOSIS — E1122 Type 2 diabetes mellitus with diabetic chronic kidney disease: Secondary | ICD-10-CM

## 2018-05-10 DIAGNOSIS — Z794 Long term (current) use of insulin: Principal | ICD-10-CM

## 2018-05-10 DIAGNOSIS — N183 Chronic kidney disease, stage 3 unspecified: Secondary | ICD-10-CM

## 2018-05-23 ENCOUNTER — Other Ambulatory Visit: Payer: Self-pay | Admitting: Family Medicine

## 2018-05-23 DIAGNOSIS — E785 Hyperlipidemia, unspecified: Secondary | ICD-10-CM

## 2018-05-23 DIAGNOSIS — I10 Essential (primary) hypertension: Secondary | ICD-10-CM

## 2018-05-27 ENCOUNTER — Encounter: Payer: Self-pay | Admitting: Family Medicine

## 2018-05-27 ENCOUNTER — Ambulatory Visit: Payer: 59 | Admitting: Family Medicine

## 2018-05-27 VITALS — BP 124/82 | HR 78 | Temp 98.2°F | Resp 12 | Ht 65.0 in | Wt 176.5 lb

## 2018-05-27 DIAGNOSIS — L209 Atopic dermatitis, unspecified: Secondary | ICD-10-CM

## 2018-05-27 DIAGNOSIS — N183 Chronic kidney disease, stage 3 unspecified: Secondary | ICD-10-CM

## 2018-05-27 DIAGNOSIS — E1165 Type 2 diabetes mellitus with hyperglycemia: Secondary | ICD-10-CM | POA: Diagnosis not present

## 2018-05-27 DIAGNOSIS — E782 Mixed hyperlipidemia: Secondary | ICD-10-CM | POA: Diagnosis not present

## 2018-05-27 DIAGNOSIS — E1149 Type 2 diabetes mellitus with other diabetic neurological complication: Secondary | ICD-10-CM | POA: Diagnosis not present

## 2018-05-27 DIAGNOSIS — F329 Major depressive disorder, single episode, unspecified: Secondary | ICD-10-CM

## 2018-05-27 DIAGNOSIS — I10 Essential (primary) hypertension: Secondary | ICD-10-CM | POA: Diagnosis not present

## 2018-05-27 DIAGNOSIS — IMO0002 Reserved for concepts with insufficient information to code with codable children: Secondary | ICD-10-CM

## 2018-05-27 LAB — BASIC METABOLIC PANEL
BUN: 22 mg/dL (ref 6–23)
CO2: 26 mEq/L (ref 19–32)
Calcium: 9.9 mg/dL (ref 8.4–10.5)
Chloride: 105 mEq/L (ref 96–112)
Creatinine, Ser: 1.16 mg/dL (ref 0.40–1.20)
GFR: 46.17 mL/min — ABNORMAL LOW (ref >60.00)
Glucose, Bld: 216 mg/dL — ABNORMAL HIGH (ref 70–99)
Potassium: 5 mEq/L (ref 3.5–5.1)
Sodium: 141 mEq/L (ref 135–145)

## 2018-05-27 LAB — LIPID PANEL
Cholesterol: 156 mg/dL (ref 0–200)
HDL: 51 mg/dL (ref >39.00)
LDL Cholesterol: 69 mg/dL (ref 0–99)
NONHDL: 104.94
Total CHOL/HDL Ratio: 3
Triglycerides: 181 mg/dL — ABNORMAL HIGH (ref 0.0–149.0)
VLDL: 36.2 mg/dL (ref 0.0–40.0)

## 2018-05-27 LAB — HEMOGLOBIN A1C: Hgb A1c MFr Bld: 7.6 % — ABNORMAL HIGH (ref 4.6–6.5)

## 2018-05-27 MED ORDER — TRIAMCINOLONE ACETONIDE 0.1 % EX CREA: 1.0000 "application " | TOPICAL_CREAM | Freq: Two times a day (BID) | CUTANEOUS | 1 refills | Status: DC | PRN

## 2018-05-27 NOTE — Assessment & Plan Note (Signed)
HgA1C done today. No changes in current management, we will adjust medications according to A1c results. Regular exercise and healthy diet with avoidance of added sugar food intake is an important part of treatment and recommended. Annual eye exam (overdue), periodic dental and foot care recommended. F/U in 5-6 months

## 2018-05-27 NOTE — Assessment & Plan Note (Signed)
Problem is a stable. No changes in current management. Instructed about warning signs.

## 2018-05-27 NOTE — Assessment & Plan Note (Signed)
Adequately controlled. No changes in current management. Continue low-salt diet. Eye exam recommended annually, she is planning on arranging appointment today. F/U in 6 months, before if needed.

## 2018-05-27 NOTE — Progress Notes (Signed)
HPI:   Dawn Huang is a 68 y.o. female, who is here today for 6 months follow up.   She was last seen on 12/05/17  DM 2: Currently she is on Jardiance 10 mg daily, Victoza 1.2 mg daily, and metformin 1000 mg twice daily. She has lost some weight, she decrease amount of snack.  She does not check BS's. Denies abdominal pain, nausea,vomiting, polydipsia,polyuria, or polyphagia.  Lab Results  Component Value Date   HGBA1C 7.5 (A) 12/05/2017   Lab Results  Component Value Date   MICROALBUR 1.1 05/21/2017    Hypertension:  Currently on amlodipine 5 mg daily, benazepril 10 mg daily, metoprolol succinate 50 mg daily, and HCTZ 12.5 mg daily. No side effects reported.  Negative for unusual headache, visual changes, exertional chest pain, dyspnea,  focal weakness, or edema.  Lab Results  Component Value Date   CREATININE 1.15 12/05/2017   BUN 21 12/05/2017   NA 139 12/05/2017   K 4.7 12/05/2017   CL 103 12/05/2017   CO2 26 12/05/2017   Hyperlipidemia: Currently she is on fenofibrate 54 mg daily and atorvastatin 20 mg daily. She has been more consistent with a low-fat diet.   Lab Results  Component Value Date   ALT 26 12/05/2017   AST 20 12/05/2017   ALKPHOS 56 12/05/2017   BILITOT 1.0 12/05/2017    She needs refills for triamcinolone cream to treat hand eczema, which has been more frequent in the past few weeks.  Depression and anxiety: She takes Zoloft 100 mg daily.  She feels like medication is still helping. Negative for depressed mood or suicidal thoughts.  Review of Systems  Constitutional: Positive for fatigue. Negative for activity change, appetite change and fever.  HENT: Negative for mouth sores, nosebleeds and trouble swallowing.   Eyes: Negative for redness and visual disturbance.  Respiratory: Negative for cough, shortness of breath and wheezing.   Cardiovascular: Negative for chest pain, palpitations and leg swelling.  Gastrointestinal:  Negative for abdominal pain, nausea and vomiting.       Negative for changes in bowel habits.  Endocrine: Negative for polydipsia, polyphagia and polyuria.  Genitourinary: Negative for decreased urine volume and hematuria.  Neurological: Negative for syncope, weakness, numbness and headaches.  Psychiatric/Behavioral: Negative for confusion. The patient is nervous/anxious.      Current Outpatient Medications on File Prior to Visit  Medication Sig Dispense Refill  . amLODipine (NORVASC) 5 MG tablet TAKE 1 TABLET BY MOUTH  DAILY 90 tablet 1  . atorvastatin (LIPITOR) 20 MG tablet TAKE 1 TABLET BY MOUTH  DAILY WITH SUPPER 90 tablet 1  . BAYER MICROLET LANCETS lancets 1 each by Other route 3 (three) times daily. Use as instructed 300 each 3  . fenofibrate 54 MG tablet Take 1 tablet (54 mg total) by mouth daily. 90 tablet 0  . fluticasone (FLONASE) 50 MCG/ACT nasal spray Place 2 sprays into both nostrils daily. 16 g 6  . glucose blood (BAYER CONTOUR NEXT TEST) test strip 1 each by Other route 3 (three) times daily. Use as instructed 300 each 3  . hydrochlorothiazide (HYDRODIURIL) 12.5 MG tablet Take 1 tablet (12.5 mg total) by mouth daily. 30 tablet 3  . Insulin Pen Needle (AURORA PEN NEEDLES) 31G X 8 MM MISC Use once daily as directed 100 each 5  . JARDIANCE 10 MG TABS tablet TAKE 1 TABLET BY MOUTH DAILY 30 tablet 4  . levothyroxine (SYNTHROID, LEVOTHROID) 100 MCG tablet TAKE 1  TABLET BY MOUTH  DAILY 90 tablet 3  . metFORMIN (GLUCOPHAGE) 1000 MG tablet TAKE 1 TABLET BY MOUTH TWO  TIMES DAILY WITH MEAL 180 tablet 2  . metoprolol succinate (TOPROL-XL) 50 MG 24 hr tablet TAKE 1 TABLET BY MOUTH  DAILY 90 tablet 1  . sertraline (ZOLOFT) 100 MG tablet TAKE 1 TABLET BY MOUTH  DAILY 90 tablet 2  . VICTOZA 18 MG/3ML SOPN INJECT SUBCUTANEOUSLY 0.2  MLS (1.2 MG TOTAL) DAILY. 18 mL 0   No current facility-administered medications on file prior to visit.      Past Medical History:  Diagnosis Date  .  Depression   . Diabetes mellitus type 2, uncontrolled (HCC) 2008  . Hyperlipidemia   . Hypertension   . Hypothyroidism   . Nephrolithiasis    No Known Allergies  Social History   Socioeconomic History  . Marital status: Single    Spouse name: Not on file  . Number of children: Not on file  . Years of education: Not on file  . Highest education level: Not on file  Occupational History  . Not on file  Social Needs  . Financial resource strain: Not on file  . Food insecurity:    Worry: Not on file    Inability: Not on file  . Transportation needs:    Medical: Not on file    Non-medical: Not on file  Tobacco Use  . Smoking status: Never Smoker  . Smokeless tobacco: Never Used  Substance and Sexual Activity  . Alcohol use: No    Alcohol/week: 0.0 standard drinks  . Drug use: No  . Sexual activity: Not on file  Lifestyle  . Physical activity:    Days per week: Not on file    Minutes per session: Not on file  . Stress: Not on file  Relationships  . Social connections:    Talks on phone: Not on file    Gets together: Not on file    Attends religious service: Not on file    Active member of club or organization: Not on file    Attends meetings of clubs or organizations: Not on file    Relationship status: Not on file  Other Topics Concern  . Not on file  Social History Narrative   Patient is divorced, she was married twice in the past. She has a son age 66, daughter age 9 who lives in [place], daughter age 58 that lives in [place]   Patient moved to [place] area from [place] 2008    Vitals:   05/27/18 0804  BP: 124/82  Pulse: 78  Resp: 12  Temp: 98.2 F (36.8 C)  SpO2: 97%   Body mass index is 29.37 kg/m.    Physical Exam  Nursing note and vitals reviewed. Constitutional: She is oriented to person, place, and time. She appears well-developed. No distress.  HENT:  Head: Normocephalic and atraumatic.  Mouth/Throat: Oropharynx is clear and moist and  mucous membranes are normal.  Eyes: Pupils are equal, round, and reactive to light. Conjunctivae are normal.  Cardiovascular: Normal rate and regular rhythm.  No murmur heard. Pulses:      Dorsalis pedis pulses are 2+ on the right side and 2+ on the left side.  Respiratory: Effort normal and breath sounds normal. No respiratory distress.  GI: Soft. She exhibits no mass. There is no hepatomegaly. There is no abdominal tenderness.  Musculoskeletal:        General: No edema.  Lymphadenopathy:  She has no cervical adenopathy.  Neurological: She is alert and oriented to person, place, and time. She has normal strength. No cranial nerve deficit. Gait normal.  Skin: Skin is warm. No rash noted. No erythema.  Psychiatric: She has a normal mood and affect.  Well groomed, good eye contact.      ASSESSMENT AND PLAN:   Ms. Osie BondSharon Keener was seen today for 6 months follow-up.  Orders Placed This Encounter  Procedures  . Basic metabolic panel  . Hemoglobin A1c  . Lipid panel   Lab Results  Component Value Date   CHOL 156 05/27/2018   HDL 51.00 05/27/2018   LDLCALC 69 05/27/2018   LDLDIRECT 94.0 12/05/2017   TRIG 181.0 (H) 05/27/2018   CHOLHDL 3 05/27/2018   Lab Results  Component Value Date   HGBA1C 7.6 (H) 05/27/2018   Lab Results  Component Value Date   CREATININE 1.16 05/27/2018   BUN 22 05/27/2018   NA 141 05/27/2018   K 5.0 05/27/2018   CL 105 05/27/2018   CO2 26 05/27/2018    Chronic kidney disease (CKD), stage III (moderate) (HCC) It has been stable. Cr 1.2-1.3 and e GFR mid 40'sContinue low-salt diet and adequate hydration. No changes in benazepril 10 mg. Adequate BP and DM 2 controlled.   Type II diabetes mellitus with neurological manifestations, uncontrolled (HCC) HgA1C done today. No changes in current management, we will adjust medications according to A1c results. Regular exercise and healthy diet with avoidance of added sugar food intake is an important  part of treatment and recommended. Annual eye exam (overdue), periodic dental and foot care recommended. F/U in 5-6 months   Hypertension Adequately controlled. No changes in current management. Continue low-salt diet. Eye exam recommended annually, she is planning on arranging appointment today. F/U in 6 months, before if needed.   Hyperlipidemia No changes in current management, will follow labs done today and will give further recommendations accordingly.   Depression Problem is a stable. No changes in current management. Instructed about warning signs.   Atopic dermatitis, unspecified type Continue topical triamcinolone cream bid prn. Side effects of topical steroid reviewed.   Return in about 6 months (around 11/25/2018) for CPE/follow up.       G. [place], MD  [facility]. [place] office.

## 2018-05-27 NOTE — Patient Instructions (Addendum)
A few things to remember from today's visit:   Type II diabetes mellitus with neurological manifestations, uncontrolled (HCC) - Plan: Basic metabolic panel, Hemoglobin A1c  Chronic kidney disease (CKD), stage III (moderate) (HCC) - Plan: Basic metabolic panel  Essential hypertension - Plan: Basic metabolic panel  Mixed hyperlipidemia - Plan: Lipid panel   Please be sure medication list is accurate. If a new problem present, please set up appointment sooner than planned today.

## 2018-05-27 NOTE — Assessment & Plan Note (Signed)
No changes in current management, will follow labs done today and will give further recommendations accordingly.  

## 2018-05-27 NOTE — Assessment & Plan Note (Signed)
It has been stable. Cr 1.2-1.3 and e GFR mid 40'sContinue low-salt diet and adequate hydration. No changes in benazepril 10 mg. Adequate BP and DM 2 controlled.

## 2018-05-30 ENCOUNTER — Encounter: Payer: Self-pay | Admitting: Family Medicine

## 2018-07-04 ENCOUNTER — Other Ambulatory Visit: Payer: Self-pay | Admitting: Family Medicine

## 2018-07-04 DIAGNOSIS — J3489 Other specified disorders of nose and nasal sinuses: Secondary | ICD-10-CM

## 2018-07-25 ENCOUNTER — Other Ambulatory Visit: Payer: Self-pay | Admitting: Family Medicine

## 2018-07-25 DIAGNOSIS — E1122 Type 2 diabetes mellitus with diabetic chronic kidney disease: Secondary | ICD-10-CM

## 2018-07-25 DIAGNOSIS — N183 Chronic kidney disease, stage 3 unspecified: Secondary | ICD-10-CM

## 2018-07-25 DIAGNOSIS — Z794 Long term (current) use of insulin: Principal | ICD-10-CM

## 2018-08-04 ENCOUNTER — Other Ambulatory Visit: Payer: Self-pay | Admitting: Family Medicine

## 2018-08-04 DIAGNOSIS — Z794 Long term (current) use of insulin: Principal | ICD-10-CM

## 2018-08-04 DIAGNOSIS — E1122 Type 2 diabetes mellitus with diabetic chronic kidney disease: Secondary | ICD-10-CM

## 2018-08-04 DIAGNOSIS — N183 Chronic kidney disease, stage 3 unspecified: Secondary | ICD-10-CM

## 2018-08-14 ENCOUNTER — Other Ambulatory Visit: Payer: Self-pay | Admitting: Family Medicine

## 2018-08-25 ENCOUNTER — Other Ambulatory Visit: Payer: Self-pay | Admitting: Family Medicine

## 2018-08-25 DIAGNOSIS — F329 Major depressive disorder, single episode, unspecified: Secondary | ICD-10-CM

## 2018-08-25 DIAGNOSIS — F32A Depression, unspecified: Secondary | ICD-10-CM

## 2018-10-28 ENCOUNTER — Other Ambulatory Visit: Payer: Self-pay | Admitting: Family Medicine

## 2018-10-28 DIAGNOSIS — E1122 Type 2 diabetes mellitus with diabetic chronic kidney disease: Secondary | ICD-10-CM

## 2018-10-28 DIAGNOSIS — N183 Chronic kidney disease, stage 3 unspecified: Secondary | ICD-10-CM

## 2018-11-09 ENCOUNTER — Other Ambulatory Visit: Payer: Self-pay | Admitting: Family Medicine

## 2018-11-09 DIAGNOSIS — E785 Hyperlipidemia, unspecified: Secondary | ICD-10-CM

## 2018-11-09 DIAGNOSIS — I10 Essential (primary) hypertension: Secondary | ICD-10-CM

## 2018-11-14 NOTE — Telephone Encounter (Signed)
Patient need to schedule an ov for more refills. 

## 2018-11-15 LAB — HM DIABETES EYE EXAM

## 2018-11-17 ENCOUNTER — Other Ambulatory Visit: Payer: Self-pay | Admitting: Family Medicine

## 2018-11-17 DIAGNOSIS — E1149 Type 2 diabetes mellitus with other diabetic neurological complication: Secondary | ICD-10-CM

## 2018-11-17 DIAGNOSIS — IMO0002 Reserved for concepts with insufficient information to code with codable children: Secondary | ICD-10-CM

## 2018-11-18 NOTE — Telephone Encounter (Signed)
Patient need to schedule an ov for more refills. 

## 2018-11-21 ENCOUNTER — Encounter: Payer: Self-pay | Admitting: Family Medicine

## 2018-11-25 ENCOUNTER — Other Ambulatory Visit: Payer: Self-pay | Admitting: Family Medicine

## 2018-11-25 DIAGNOSIS — I10 Essential (primary) hypertension: Secondary | ICD-10-CM

## 2018-11-25 DIAGNOSIS — E785 Hyperlipidemia, unspecified: Secondary | ICD-10-CM

## 2018-11-28 ENCOUNTER — Encounter: Payer: Self-pay | Admitting: Family Medicine

## 2018-12-24 ENCOUNTER — Other Ambulatory Visit: Payer: Self-pay

## 2018-12-24 ENCOUNTER — Encounter: Payer: Self-pay | Admitting: Family Medicine

## 2018-12-24 ENCOUNTER — Ambulatory Visit (INDEPENDENT_AMBULATORY_CARE_PROVIDER_SITE_OTHER): Payer: 59 | Admitting: Family Medicine

## 2018-12-24 VITALS — BP 130/84 | HR 65 | Temp 97.9°F | Resp 12 | Ht 64.25 in | Wt 174.2 lb

## 2018-12-24 DIAGNOSIS — F329 Major depressive disorder, single episode, unspecified: Secondary | ICD-10-CM

## 2018-12-24 DIAGNOSIS — N183 Chronic kidney disease, stage 3 (moderate): Secondary | ICD-10-CM

## 2018-12-24 DIAGNOSIS — L209 Atopic dermatitis, unspecified: Secondary | ICD-10-CM | POA: Diagnosis not present

## 2018-12-24 DIAGNOSIS — Z0000 Encounter for general adult medical examination without abnormal findings: Secondary | ICD-10-CM

## 2018-12-24 DIAGNOSIS — Z794 Long term (current) use of insulin: Secondary | ICD-10-CM

## 2018-12-24 DIAGNOSIS — E1122 Type 2 diabetes mellitus with diabetic chronic kidney disease: Secondary | ICD-10-CM

## 2018-12-24 DIAGNOSIS — Z23 Encounter for immunization: Secondary | ICD-10-CM | POA: Diagnosis not present

## 2018-12-24 DIAGNOSIS — M25561 Pain in right knee: Secondary | ICD-10-CM

## 2018-12-24 DIAGNOSIS — E1165 Type 2 diabetes mellitus with hyperglycemia: Secondary | ICD-10-CM | POA: Diagnosis not present

## 2018-12-24 DIAGNOSIS — E1149 Type 2 diabetes mellitus with other diabetic neurological complication: Secondary | ICD-10-CM

## 2018-12-24 DIAGNOSIS — E782 Mixed hyperlipidemia: Secondary | ICD-10-CM | POA: Diagnosis not present

## 2018-12-24 DIAGNOSIS — IMO0002 Reserved for concepts with insufficient information to code with codable children: Secondary | ICD-10-CM

## 2018-12-24 LAB — MICROALBUMIN / CREATININE URINE RATIO
Creatinine,U: 99.3 mg/dL
Microalb Creat Ratio: 0.9 mg/g (ref 0.0–30.0)
Microalb, Ur: 0.9 mg/dL (ref 0.0–1.9)

## 2018-12-24 LAB — COMPREHENSIVE METABOLIC PANEL
ALT: 15 U/L (ref 0–35)
AST: 14 U/L (ref 0–37)
Albumin: 4.7 g/dL (ref 3.5–5.2)
Alkaline Phosphatase: 49 U/L (ref 39–117)
BUN: 26 mg/dL — ABNORMAL HIGH (ref 6–23)
CO2: 26 mEq/L (ref 19–32)
Calcium: 9.7 mg/dL (ref 8.4–10.5)
Chloride: 104 mEq/L (ref 96–112)
Creatinine, Ser: 1.27 mg/dL — ABNORMAL HIGH (ref 0.40–1.20)
GFR: 41.52 mL/min — ABNORMAL LOW (ref >60.00)
Glucose, Bld: 144 mg/dL — ABNORMAL HIGH (ref 70–99)
Potassium: 4.1 mEq/L (ref 3.5–5.1)
Sodium: 140 mEq/L (ref 135–145)
Total Bilirubin: 0.9 mg/dL (ref 0.2–1.2)
Total Protein: 7.4 g/dL (ref 6.0–8.3)

## 2018-12-24 LAB — HEMOGLOBIN A1C: Hgb A1c MFr Bld: 6.6 % — ABNORMAL HIGH (ref 4.6–6.5)

## 2018-12-24 LAB — LIPID PANEL
Cholesterol: 152 mg/dL (ref 0–200)
HDL: 58.3 mg/dL (ref >39.00)
LDL Cholesterol: 65 mg/dL (ref 0–99)
NonHDL: 93.73
Total CHOL/HDL Ratio: 3
Triglycerides: 142 mg/dL (ref 0.0–149.0)
VLDL: 28.4 mg/dL (ref 0.0–40.0)

## 2018-12-24 MED ORDER — METFORMIN HCL 1000 MG PO TABS: ORAL_TABLET | ORAL | 2 refills | Status: DC

## 2018-12-24 MED ORDER — JARDIANCE 10 MG PO TABS: 10.0000 mg | ORAL_TABLET | Freq: Every day | ORAL | 2 refills | Status: DC

## 2018-12-24 MED ORDER — VICTOZA 18 MG/3ML ~~LOC~~ SOPN: PEN_INJECTOR | SUBCUTANEOUS | 2 refills | Status: DC

## 2018-12-24 MED ORDER — TRIAMCINOLONE ACETONIDE 0.1 % EX CREA: 1.0000 "application " | TOPICAL_CREAM | Freq: Two times a day (BID) | CUTANEOUS | 1 refills | Status: DC | PRN

## 2018-12-24 NOTE — Patient Instructions (Addendum)
A few things to remember from today's visit:   Routine general medical examination at a health care facility  Type II diabetes mellitus with neurological manifestations, uncontrolled ([place]) - Plan: Comprehensive metabolic panel, Hemoglobin A1c, Lipid panel, Microalbumin / creatinine urine ratio  Chronic kidney disease (CKD), stage III (moderate) (HCC) - Plan: Comprehensive metabolic panel  Essential hypertension - Plan: Comprehensive metabolic panel  Major depressive disorder with single episode, remission status unspecified  Atopic dermatitis, unspecified type - Plan: triamcinolone cream (KENALOG) 0.1 %  Mixed hyperlipidemia  A few tips:  -As we age balance is not as good as it was, so there is a higher risks for falls. Please remove small rugs and furniture that is "in your way" and could increase the risk of falls. Stretching exercises may help with fall prevention: Yoga and Tai Chi are some examples. Low impact exercise is better, so you are not very achy the next day.  -Sun screen and avoidance of direct sun light recommended. Caution with dehydration, if working outdoors be sure to drink enough fluids.  - Some medications are not safe as we age, increases the risk of side effects and can potentially interact with other medication you are also taken;  including some of over the counter medications. Be sure to let me know when you start a new medication even if it is a dietary/vitamin supplement. Wt Readings from Last 3 Encounters:  12/24/18 174 lb 3.2 oz (79 kg)  05/27/18 176 lb 8 oz (80.1 kg)  12/05/17 182 lb 2 oz (82.6 kg)     -Healthy diet low in red meet/animal fat and sugar + regular physical activity is recommended.   Colonoscopy DEXA Glaucoma screening/eye exam every 1-2 years.  Mammogram for breast cancer screening annually.  Flu vaccine annually.  Fall prevention   Advance directives:  Please see a lawyer and/or go to this website to help you with advanced  directives and designating a health care power of attorney so that your wishes will be followed should you become too ill to make your own medical decisions.  [email or web address]

## 2018-12-24 NOTE — Progress Notes (Signed)
HPI:   [name] is a 68 y.o. female, who is here today for her routine physical.  Last CPE: 04/13/2017.  Regular exercise 3 or more time per week: " A lot of walking" at work. Following a healthy diet: Healthier since 06/2018. She lives with her son and his girlfriend.  Chronic medical problems: DM 2, hypertension, hypothyroidism, CKD,hyperlipidemia, and depression among some.   Immunization History  Administered Date(s) Administered  . Fluad Quad(high Dose 65+) 12/24/2018  . Influenza,inj,Quad PF,6+ Mos 05/26/2013  . Influenza-Unspecified 01/19/2014, 01/30/2015  . Pneumococcal Conjugate-13 01/23/2013  . Pneumococcal Polysaccharide-23 09/12/2012, 12/05/2017  . Tdap 09/12/2012  . Zoster 05/26/2013    Mammogram: 2016, BI-RADS 1.  She was planning on having mammogram that at work but she missed it, she was off that day. She is planning on having it done next July. Colonoscopy: 07/2015,10 years f/u was recommended. DEXA: She has not had it done.  Hep C screening: 03/2017 NR.  Concerns today: Right knee pain after prolonged sitting, resolved after a few steps. Pain is localized in the posterior aspect of her knee. No history of trauma or unusual physical activity. She is in a different office, where she is no longer having a foot raise. She denies limitation of range of motion, edema, or erythema. She has not tried OTC medications.  DM 2: She is not checking BS. Currently she is on Victoza 1.2 mg daily, Jardiance10 mg daily, and metformin 1000 mg twice daily. She is tolerating medication well.  Lab Results  Component Value Date   HGBA1C 7.6 (H) 05/27/2018   Lab Results  Component Value Date   CREATININE 1.16 05/27/2018   BUN 22 05/27/2018   NA 141 05/27/2018   K 5.0 05/27/2018   CL 105 05/27/2018   CO2 26 05/27/2018   Hyperlipidemia: Currently she is on Lipitor 20 mg daily and fenofibrate 54 mg daily. She has tolerated medication well, she has not  noted side effects. She would like to have lipid panel rechecked today.  Lab Results  Component Value Date   CHOL 156 05/27/2018   HDL 51.00 05/27/2018   LDLCALC 69 05/27/2018   LDLDIRECT 94.0 12/05/2017   TRIG 181.0 (H) 05/27/2018   CHOLHDL 3 05/27/2018   She is also requesting refill for triamcinolone cream, which she uses as needed for hand erythematosus pruritic rash. Problem has been aggravated by frequent use of hand sanitizer.  Depression: Currently she is on sertraline 100 mg daily. She feels like medication is helping. Negative for suicidal thoughts.  Review of Systems  Constitutional: Negative for activity change, appetite change, fatigue and fever.  HENT: Negative for hearing loss, mouth sores, trouble swallowing and voice change.   Eyes: Negative for redness and visual disturbance.  Respiratory: Negative for cough, shortness of breath and wheezing.   Cardiovascular: Negative for chest pain, palpitations and leg swelling.  Gastrointestinal: Negative for abdominal pain, nausea and vomiting.       No changes in bowel habits.  Endocrine: Negative for cold intolerance, heat intolerance, polydipsia, polyphagia and polyuria.  Genitourinary: Negative for decreased urine volume, dysuria, hematuria, vaginal bleeding and vaginal discharge.  Musculoskeletal: Negative for gait problem and myalgias.  Skin: Negative for color change and wound.  Allergic/Immunologic: Negative for environmental allergies.  Neurological: Negative for syncope, weakness and headaches.  Psychiatric/Behavioral: Negative for confusion. The patient is not nervous/anxious.   All other systems reviewed and are negative.   Current Outpatient Medications on File Prior to  Visit  Medication Sig Dispense Refill  . amLODipine (NORVASC) 5 MG tablet TAKE 1 TABLET BY MOUTH  DAILY 90 tablet 3  . atorvastatin (LIPITOR) 20 MG tablet TAKE 1 TABLET BY MOUTH  DAILY WITH SUPPER 90 tablet 3  . BAYER MICROLET LANCETS  lancets 1 each by Other route 3 (three) times daily. Use as instructed 300 each 3  . fenofibrate 54 MG tablet Take 1 tablet (54 mg total) by mouth daily. 90 tablet 0  . fluticasone (FLONASE) 50 MCG/ACT nasal spray SHAKE LIQUID AND USE 2 SPRAYS IN EACH NOSTRIL DAILY 16 g 6  . glucose blood (BAYER CONTOUR NEXT TEST) test strip 1 each by Other route 3 (three) times daily. Use as instructed 300 each 3  . hydrochlorothiazide (HYDRODIURIL) 12.5 MG tablet Take 1 tablet (12.5 mg total) by mouth daily. 30 tablet 3  . Insulin Pen Needle (AURORA PEN NEEDLES) 31G X 8 MM MISC Use once daily as directed 100 each 5  . levothyroxine (SYNTHROID) 100 MCG tablet TAKE 1 TABLET BY MOUTH  DAILY 90 tablet 3  . metoprolol succinate (TOPROL-XL) 50 MG 24 hr tablet TAKE 1 TABLET BY MOUTH  DAILY 90 tablet 3  . sertraline (ZOLOFT) 100 MG tablet TAKE 1 TABLET BY MOUTH  DAILY 90 tablet 1   No current facility-administered medications on file prior to visit.      Past Medical History:  Diagnosis Date  . Depression   . Diabetes mellitus type 2, uncontrolled (HCC) 2008  . Hyperlipidemia   . Hypertension   . Hypothyroidism   . Nephrolithiasis     Past Surgical History:  Procedure Laterality Date  . IR GENERIC HISTORICAL  11/12/2015   IR VERTEBROPLASTY LUMBAR BX INC UNI/BIL INC/INJECT/IMAGING 11/12/2015 Julieanne CottonSanjeev Deveshwar, MD [facility]  . IR GENERIC HISTORICAL  11/02/2015   IR RADIOLOGIST EVAL & MGMT 11/02/2015 Simonne ComeJohn Watts, MD [facility]  . TONSILLECTOMY  1968    No Known Allergies  Family History  Problem Relation Age of Onset  . Coronary artery disease Father   . Coronary artery disease Brother   . Breast cancer Daughter   . Cancer Daughter        breast    Social History   Socioeconomic History  . Marital status: Single    Spouse name: Not on file  . Number of children: Not on file  . Years of education: Not on file  . Highest education level: Not on file  Occupational History  . Not on file   Social Needs  . Financial resource strain: Not on file  . Food insecurity    Worry: Not on file    Inability: Not on file  . Transportation needs    Medical: Not on file    Non-medical: Not on file  Tobacco Use  . Smoking status: Never Smoker  . Smokeless tobacco: Never Used  Substance and Sexual Activity  . Alcohol use: No    Alcohol/week: 0.0 standard drinks  . Drug use: No  . Sexual activity: Not on file  Lifestyle  . Physical activity    Days per week: Not on file    Minutes per session: Not on file  . Stress: Not on file  Relationships  . Social Musicianconnections    Talks on phone: Not on file    Gets together: Not on file    Attends religious service: Not on file    Active member of club or organization: Not on file  Attends meetings of clubs or organizations: Not on file    Relationship status: Not on file  Other Topics Concern  . Not on file  Social History Narrative   Patient is divorced, she was married twice in the past. She has a son age 70, daughter age 45 who lives in [place], daughter age 80 that lives in [place]   Patient moved to [place] area from [place]:   12/24/18 0710  BP: 130/84  Pulse: 65  Resp: 12  Temp: 97.9 F (36.6 C)  SpO2: 97%   Body mass index is 29.67 kg/m.   Wt Readings from Last 3 Encounters:  12/24/18 174 lb 3.2 oz (79 kg)  05/27/18 176 lb 8 oz (80.1 kg)  12/05/17 182 lb 2 oz (82.6 kg)    Physical Exam  Nursing note and vitals reviewed. Constitutional: She is oriented to person, place, and time. She appears well-developed. No distress.  HENT:  Head: Normocephalic and atraumatic.  Right Ear: Hearing, tympanic membrane, external ear and ear canal normal.  Left Ear: Hearing, tympanic membrane, external ear and ear canal normal.  Mouth/Throat: Uvula is midline, oropharynx is clear and moist and mucous membranes are normal.  Eyes: Pupils are equal, round, and reactive to light. Conjunctivae and EOM are  normal.  Neck: No tracheal deviation present.  Cardiovascular: Normal rate and regular rhythm.  No murmur heard. Pulses:      Dorsalis pedis pulses are 2+ on the right side and 2+ on the left side.  Respiratory: Effort normal and breath sounds normal. No respiratory distress. No breast swelling or tenderness.  GI: Soft. She exhibits no mass. There is no hepatomegaly. There is no abdominal tenderness.  Genitourinary: No breast swelling, tenderness or discharge.    Genitourinary Comments: Breast: No masses, skin changes, or nipple discharge bilateral.   Musculoskeletal:        General: No edema.     Right knee: She exhibits normal range of motion, no effusion and no erythema.     Comments: No signs of synovitis appreciated.  Lymphadenopathy:    She has no cervical adenopathy.    She has no axillary adenopathy.       Right: No supraclavicular adenopathy present.       Left: No supraclavicular adenopathy present.  Neurological: She is alert and oriented to person, place, and time. She has normal strength. No cranial nerve deficit. Coordination and gait normal.  Reflex Scores:      Bicep reflexes are 2+ on the right side and 2+ on the left side.      Patellar reflexes are 2+ on the right side and 2+ on the left side. Skin: Skin is warm. No rash noted. No erythema.  Psychiatric: She has a normal mood and affect. Cognition and memory are normal.  Well groomed, good eye contact.   ASSESSMENT AND PLAN:  Ms. Dawn Huang was here today annual physical examination.  Orders Placed This Encounter  Procedures  . Flu Vaccine QUAD High Dose(Fluad)  . Comprehensive metabolic panel  . Hemoglobin A1c  . Lipid panel  . Microalbumin / creatinine urine ratio   Lab Results  Component Value Date   CREATININE 1.27 (H) 12/24/2018   BUN 26 (H) 12/24/2018   NA 140 12/24/2018   K 4.1 12/24/2018   CL 104 12/24/2018   CO2 26 12/24/2018   Lab Results  Component Value Date   HGBA1C 6.6 (H) 12/24/2018    Lab Results  Component Value Date   CHOL 152 12/24/2018   HDL 58.30 12/24/2018   LDLCALC 65 12/24/2018   LDLDIRECT 94.0 12/05/2017   TRIG 142.0 12/24/2018   CHOLHDL 3 12/24/2018    Lab Results  Component Value Date   ALT 15 12/24/2018   AST 14 12/24/2018   ALKPHOS 49 12/24/2018   BILITOT 0.9 12/24/2018   Lab Results  Component Value Date   MICROALBUR 0.9 12/24/2018   Routine general medical examination at a health care facility We discussed the importance of regular physical activity and healthy diet for prevention of chronic illness and/or complications. Preventive guidelines reviewed. She does not want to have mammogram or DEXA at this time, she will let me know if she decides to do so. Vaccination up-to-date, influenza vaccine given today.  Ca++ and vit D supplementation to continue Next CPE in a year.  Type II diabetes mellitus with neurological manifestations, uncontrolled (HCC) No changes in current management, will follow HgA1C done today and will give further recommendations accordingly. Educated on the importance of appropriate foot care and periodic/annual eye exam and dental hygiene. Follow-up in 6 months.  -     Comprehensive metabolic panel -     Hemoglobin A1c -     Microalbumin / creatinine urine ratio  Major depressive disorder with single episode, remission status unspecified Problem is well controlled with sertraline 100 mg daily.  Atopic dermatitis, unspecified type Problem is well controlled with topical steroid. We discussed some side effects of chronic topical steroid use. Try to avoid trigger factors.  -     triamcinolone cream (KENALOG) 0.1 %; Apply 1 application topically 2 (two) times daily as needed.  Mixed hyperlipidemia Continue atorvastatin 20 mg daily. Further recommendation will be given according to lipid panel results.  -     Lipid panel  Need for influenza vaccination -     Flu Vaccine QUAD High Dose(Fluad)  Right knee  pain, unspecified chronicity ? OA. Examination today negative. Given the fact there is no history of trauma, I do not think knee imaging is needed at this time.    Return in 6 months (on 06/23/2019) for DM II,HTN.   Betty G. [place], MD  [facility]. [place] office.

## 2019-01-08 ENCOUNTER — Encounter: Payer: Self-pay | Admitting: Family Medicine

## 2019-01-13 ENCOUNTER — Encounter: Payer: Self-pay | Admitting: Family Medicine

## 2019-01-14 NOTE — Telephone Encounter (Signed)
Please advise if you need me to schedule an appointment for her. Thank you

## 2019-01-16 ENCOUNTER — Encounter: Payer: Self-pay | Admitting: Family Medicine

## 2019-02-07 ENCOUNTER — Other Ambulatory Visit: Payer: Self-pay | Admitting: Family Medicine

## 2019-02-07 DIAGNOSIS — F329 Major depressive disorder, single episode, unspecified: Secondary | ICD-10-CM

## 2019-02-07 DIAGNOSIS — F32A Depression, unspecified: Secondary | ICD-10-CM

## 2019-04-29 ENCOUNTER — Encounter: Payer: Self-pay | Admitting: Family Medicine

## 2019-04-30 ENCOUNTER — Encounter: Payer: Self-pay | Admitting: Family Medicine

## 2019-04-30 ENCOUNTER — Other Ambulatory Visit: Payer: Self-pay

## 2019-04-30 ENCOUNTER — Ambulatory Visit (INDEPENDENT_AMBULATORY_CARE_PROVIDER_SITE_OTHER): Payer: 59

## 2019-04-30 ENCOUNTER — Ambulatory Visit: Payer: 59 | Admitting: Family Medicine

## 2019-04-30 VITALS — BP 120/70 | HR 76 | Temp 95.6°F | Resp 16 | Ht 64.25 in | Wt 177.0 lb

## 2019-04-30 DIAGNOSIS — M25561 Pain in right knee: Secondary | ICD-10-CM | POA: Diagnosis not present

## 2019-04-30 DIAGNOSIS — N1831 Chronic kidney disease, stage 3a: Secondary | ICD-10-CM

## 2019-04-30 DIAGNOSIS — M170 Bilateral primary osteoarthritis of knee: Secondary | ICD-10-CM

## 2019-04-30 DIAGNOSIS — E1149 Type 2 diabetes mellitus with other diabetic neurological complication: Secondary | ICD-10-CM

## 2019-04-30 DIAGNOSIS — E1165 Type 2 diabetes mellitus with hyperglycemia: Secondary | ICD-10-CM

## 2019-04-30 DIAGNOSIS — I10 Essential (primary) hypertension: Secondary | ICD-10-CM

## 2019-04-30 DIAGNOSIS — IMO0002 Reserved for concepts with insufficient information to code with codable children: Secondary | ICD-10-CM

## 2019-04-30 MED ORDER — TRAMADOL HCL 50 MG PO TABS: 50.0000 mg | ORAL_TABLET | Freq: Every evening | ORAL | 0 refills | Status: AC | PRN

## 2019-04-30 MED ORDER — DICLOFENAC SODIUM 1 % EX GEL: 4.0000 g | Freq: Four times a day (QID) | CUTANEOUS | 1 refills | Status: DC

## 2019-04-30 NOTE — Patient Instructions (Addendum)
A few things to remember from today's visit:   Right knee pain, unspecified chronicity - Plan: DG Knee Complete 4 Views Right, traMADol (ULTRAM) 50 MG tablet  Osteoarthritis of both knees, unspecified osteoarthritis type - Plan: diclofenac Sodium (VOLTAREN) 1 % GEL  Essential hypertension  Stage 3a chronic kidney disease   Please be sure medication list is accurate. If a new problem present, please set up appointment sooner than planned today.

## 2019-04-30 NOTE — Progress Notes (Signed)
ACUTE VISIT   HPI:  Chief Complaint  Patient presents with  . right knee pain    Ms.Zakayla Martinec is a 69 y.o. female, who is here today complaining of right knee pain. She has had bilateral knee pain but right knee has been getting worse for the past few months. Pain is exacerbated by getting up after prolonged resting, prolonged standing, prolonged walking. It is alleviated by rest. Medial dull like pain , 9-10/10 max, not bad today so far. Sometimes radiated to posterior aspect of knee.  She has some limitation of range of motion. Pain is interfering with his sleep.  No history of recent trauma. Last week she heard a "pop" in right knee when she was turning, very painful. She has not noted erythema or edema. She has not had recent medication.  She has not seen ortho before or done PT.  She has hx of HTN,CKD,and DM II.  Lab Results  Component Value Date   CREATININE 1.27 (H) 12/24/2018   BUN 26 (H) 12/24/2018   NA 140 12/24/2018   K 4.1 12/24/2018   CL 104 12/24/2018   CO2 26 12/24/2018   Lab Results  Component Value Date   HGBA1C 6.6 (H) 12/24/2018    Review of Systems  Constitutional: Positive for activity change. Negative for chills and fever.  Respiratory: Negative for cough, shortness of breath and stridor.   Cardiovascular: Negative for chest pain and palpitations.  Musculoskeletal: Positive for gait problem. Negative for myalgias.  Skin: Negative for rash and wound.  Neurological: Negative for weakness and numbness.  Rest see pertinent positives and negatives per HPI.   Current Outpatient Medications on File Prior to Visit  Medication Sig Dispense Refill  . amLODipine (NORVASC) 5 MG tablet TAKE 1 TABLET BY MOUTH  DAILY 90 tablet 3  . atorvastatin (LIPITOR) 20 MG tablet TAKE 1 TABLET BY MOUTH  DAILY WITH SUPPER 90 tablet 3  . BAYER MICROLET LANCETS lancets 1 each by Other route 3 (three) times daily. Use as instructed 300 each 3  .  empagliflozin (JARDIANCE) 10 MG TABS tablet Take 10 mg by mouth daily. 90 tablet 2  . fenofibrate 54 MG tablet Take 1 tablet (54 mg total) by mouth daily. 90 tablet 0  . fluticasone (FLONASE) 50 MCG/ACT nasal spray SHAKE LIQUID AND USE 2 SPRAYS IN EACH NOSTRIL DAILY 16 g 6  . glucose blood (BAYER CONTOUR NEXT TEST) test strip 1 each by Other route 3 (three) times daily. Use as instructed 300 each 3  . hydrochlorothiazide (HYDRODIURIL) 12.5 MG tablet Take 1 tablet (12.5 mg total) by mouth daily. 30 tablet 3  . Insulin Pen Needle (AURORA PEN NEEDLES) 31G X 8 MM MISC Use once daily as directed 100 each 5  . levothyroxine (SYNTHROID) 100 MCG tablet TAKE 1 TABLET BY MOUTH  DAILY 90 tablet 3  . liraglutide (VICTOZA) 18 MG/3ML SOPN INJECT SUBCUTANEOUSLY 0.2ML (1.2MG  TOTAL) DAILY. 18 mL 2  . metFORMIN (GLUCOPHAGE) 1000 MG tablet TAKE 1 TABLET BY MOUTH TWO  TIMES DAILY WITH MEAL 180 tablet 2  . metoprolol succinate (TOPROL-XL) 50 MG 24 hr tablet TAKE 1 TABLET BY MOUTH  DAILY 90 tablet 3  . sertraline (ZOLOFT) 100 MG tablet TAKE 1 TABLET BY MOUTH  DAILY 90 tablet 3  . triamcinolone cream (KENALOG) 0.1 % Apply 1 application topically 2 (two) times daily as needed. 45 g 1   No current facility-administered medications on file prior to visit.  Past Medical History:  Diagnosis Date  . Depression   . Diabetes mellitus type 2, uncontrolled (HCC) 2008  . Hyperlipidemia   . Hypertension   . Hypothyroidism   . Nephrolithiasis    No Known Allergies  Social History   Socioeconomic History  . Marital status: Single    Spouse name: Not on file  . Number of children: Not on file  . Years of education: Not on file  . Highest education level: Not on file  Occupational History  . Not on file  Tobacco Use  . Smoking status: Never Smoker  . Smokeless tobacco: Never Used  Substance and Sexual Activity  . Alcohol use: No    Alcohol/week: 0.0 standard drinks  . Drug use: No  . Sexual activity: Not on  file  Other Topics Concern  . Not on file  Social History Narrative   Patient is divorced, she was married twice in the past. She has a son age 47, daughter age 71 who lives in [place], daughter age 75 that lives in [place]   Patient moved to [place] area from [place] 2008   Social Determinants of Health   Financial Resource Strain:   . Difficulty of Paying Living Expenses: Not on file  Food Insecurity:   . Worried About Programme researcher, broadcasting/film/video in the Last Year: Not on file  . Ran Out of Food in the Last Year: Not on file  Transportation Needs:   . Lack of Transportation (Medical): Not on file  . Lack of Transportation (Non-Medical): Not on file  Physical Activity:   . Days of Exercise per Week: Not on file  . Minutes of Exercise per Session: Not on file  Stress:   . Feeling of Stress : Not on file  Social Connections:   . Frequency of Communication with Friends and Family: Not on file  . Frequency of Social Gatherings with Friends and Family: Not on file  . Attends Religious Services: Not on file  . Active Member of Clubs or Organizations: Not on file  . Attends Banker Meetings: Not on file  . Marital Status: Not on file    Vitals:   04/30/19 0858  BP: 120/70  Pulse: 76  Resp: 16  Temp: (!) 95.6 F (35.3 C)  SpO2: 97%   Body mass index is 30.15 kg/m.   Physical Exam  Nursing note and vitals reviewed. Constitutional: She is oriented to person, place, and time. She appears well-developed. She does not appear ill. No distress.  HENT:  Head: Normocephalic and atraumatic.  Eyes: Conjunctivae are normal.  Cardiovascular: Normal rate and regular rhythm.  Pulses:      Dorsalis pedis pulses are 2+ on the right side.       Posterior tibial pulses are 2+ on the right side.  Calf tenderness: Negative.  Respiratory: Effort normal. No respiratory distress.  Musculoskeletal:        General: No edema.     Comments: Right knee: on inspection mild effusion,no  erythema or deformities. Valgus and varus stress normal, McMurray negative, anterior and posterior drawer test negative. Patellar apprehension test negative. Limitation of flexion. No masses appreciated upon palpation of poplitea fossa. Antalgic gait.   Neurological: She is alert and oriented to person, place, and time. She has normal strength.  Skin: Skin is warm. No rash noted. No erythema.  Psychiatric: She has a normal mood and affect.  Well groomed, good eye contact.   ASSESSMENT AND PLAN:  Ms.  Etoy was seen today for right knee pain.  Diagnoses and all orders for this visit:  Right knee pain, unspecified chronicity We discussed possible etiologies, including OA, meniscal disease. Reviewed treatment options, because of history of CKD and hypertension, NSAIDs are not recommended. Intra-articular injection with a steroid could aggravate DM. She agrees with topical NSAIDs. We discussed some side effects of tramadol, so recommend taking it at bedtime. For now she is interested in PT or Ortho referral.  Follow-up in 2 months.  -     DG Knee Complete 4 Views Right -     traMADol (ULTRAM) 50 MG tablet; Take 1 tablet (50 mg total) by mouth at bedtime as needed for up to 10 days.  Osteoarthritis of both knees, unspecified osteoarthritis type We discussed diagnosis, prognosis, and treatment options. Recommend avoiding activities that can trigger pain. Weight control. She will benefit from low impact exercise, aquatic.  -     diclofenac Sodium (VOLTAREN) 1 % GEL; Apply 4 g topically 4 (four) times daily.  Essential hypertension BP has been adequately controlled. We discussed side effects of NSAIDs. No changes in current management.  Stage 3a chronic kidney disease Problem has been a stable. Instructed that NSAIDs in general recommended, it could aggravate problem. Adequate hydration, BP, and glucose control.  Type II diabetes mellitus with neurological manifestations,  uncontrolled (HCC) A1c has been at goal. We discussed side effects of steroids including glucose elevation.  Return in about 2 months (around 06/28/2019).   Burnham Trost G. [place], MD  [facility]. [place] office.

## 2019-05-01 ENCOUNTER — Encounter: Payer: Self-pay | Admitting: Family Medicine

## 2019-05-05 ENCOUNTER — Encounter: Payer: Self-pay | Admitting: Family Medicine

## 2019-07-14 ENCOUNTER — Other Ambulatory Visit: Payer: Self-pay | Admitting: Family Medicine

## 2019-07-15 ENCOUNTER — Other Ambulatory Visit: Payer: Self-pay | Admitting: Family Medicine

## 2019-07-15 ENCOUNTER — Encounter: Payer: Self-pay | Admitting: Family Medicine

## 2019-07-15 DIAGNOSIS — E1149 Type 2 diabetes mellitus with other diabetic neurological complication: Secondary | ICD-10-CM

## 2019-07-15 DIAGNOSIS — L209 Atopic dermatitis, unspecified: Secondary | ICD-10-CM

## 2019-07-15 DIAGNOSIS — J3489 Other specified disorders of nose and nasal sinuses: Secondary | ICD-10-CM

## 2019-07-15 DIAGNOSIS — IMO0002 Reserved for concepts with insufficient information to code with codable children: Secondary | ICD-10-CM

## 2019-07-16 MED ORDER — TRIAMCINOLONE ACETONIDE 0.1 % EX CREA: 1.0000 "application " | TOPICAL_CREAM | Freq: Two times a day (BID) | CUTANEOUS | 1 refills | Status: DC | PRN

## 2019-07-16 MED ORDER — JARDIANCE 10 MG PO TABS: 10.0000 mg | ORAL_TABLET | Freq: Every day | ORAL | 2 refills | Status: DC

## 2019-08-06 ENCOUNTER — Encounter: Payer: Self-pay | Admitting: Family Medicine

## 2019-08-06 ENCOUNTER — Other Ambulatory Visit: Payer: Self-pay

## 2019-08-06 ENCOUNTER — Ambulatory Visit: Payer: 59 | Admitting: Family Medicine

## 2019-08-06 VITALS — BP 150/90 | HR 91 | Temp 97.8°F | Resp 12 | Ht 64.25 in | Wt 175.4 lb

## 2019-08-06 DIAGNOSIS — M170 Bilateral primary osteoarthritis of knee: Secondary | ICD-10-CM

## 2019-08-06 DIAGNOSIS — IMO0002 Reserved for concepts with insufficient information to code with codable children: Secondary | ICD-10-CM

## 2019-08-06 DIAGNOSIS — E1149 Type 2 diabetes mellitus with other diabetic neurological complication: Secondary | ICD-10-CM | POA: Diagnosis not present

## 2019-08-06 DIAGNOSIS — I10 Essential (primary) hypertension: Secondary | ICD-10-CM

## 2019-08-06 DIAGNOSIS — N1831 Chronic kidney disease, stage 3a: Secondary | ICD-10-CM | POA: Diagnosis not present

## 2019-08-06 DIAGNOSIS — E1165 Type 2 diabetes mellitus with hyperglycemia: Secondary | ICD-10-CM

## 2019-08-06 LAB — POCT GLYCOSYLATED HEMOGLOBIN (HGB A1C): Hemoglobin A1C: 8.4 % — AB (ref 4.0–5.6)

## 2019-08-06 MED ORDER — BENAZEPRIL HCL 10 MG PO TABS: 10.0000 mg | ORAL_TABLET | Freq: Every day | ORAL | 1 refills | Status: DC

## 2019-08-06 MED ORDER — DICLOFENAC SODIUM 1 % EX GEL: 4.0000 g | Freq: Four times a day (QID) | CUTANEOUS | 1 refills | Status: DC

## 2019-08-06 NOTE — Patient Instructions (Addendum)
A few things to remember from today's visit:   Today Benazepril 10 mg added. No changes in rest of meds. Eventually we can try to wean off Metoprolol.  We need lab in 7-10 days.  Continue monitoring blood pressure at home.  Avoid Ibuprofen or Naproxen, just tylenol for pain. Topical diclofenac for now. You may want to discuss option for knee pain with ortho.  Jardiance increased from 10 mg to 25 mg.  Please be sure medication list is accurate. If a new problem present, please set up appointment sooner than planned today.

## 2019-08-06 NOTE — Assessment & Plan Note (Signed)
Problem is not well controlled. Tylenol 500 mg 3 times per day and topical Voltaren. Recommend discussing with ortho treatment options.

## 2019-08-06 NOTE — Assessment & Plan Note (Signed)
Not well controlled. Possible complications of elevated BP discussed. Benazepril 10 mg added today. No changes in trest of antihypertensive meds. BMP in 7-10 days. BP check in 6 weeks.Marland Kitchen

## 2019-08-06 NOTE — Assessment & Plan Note (Signed)
HgA1C is not at goal. ° °Regular exercise and healthy diet with avoidance of added sugar food intake is an important part of treatment and recommended. °Annual eye exam, periodic dental and foot care recommended. °F/U in 5-6 months ° °

## 2019-08-06 NOTE — Assessment & Plan Note (Signed)
Some side effects of NSAID's discussed. Adequate BP and glucose controlled. Low salt diet and adequate hydration.

## 2019-08-06 NOTE — Progress Notes (Signed)
Chief Complaint  Patient presents with  . Follow-up    6 month follow-up   HPI: [name] is a 69 y.o. female, who is here today for chronic disease management. She was last seen on 04/30/19 for knee pain.  She is concerned about elevated BP's for the past few days. She knew BP was elevated because headache.  She thinks it may have been caused by eating grapefruit for 2 days. She had throbbing like headache,"bad" for 1-2 days last week. No associated focal weakness,vomiting,or MS changes.  Vision has been "off" since last week, also improving. Last eye exam 08/2018.  Negative for chest pain, dyspnea, palpitation, focal weakness, or edema.  Lab Results  Component Value Date   CREATININE 1.27 (H) 12/24/2018   BUN 26 (H) 12/24/2018   NA 140 12/24/2018   K 4.1 12/24/2018   CL 104 12/24/2018   CO2 26 12/24/2018    CKD III: She has not noted gross hematuria,decreased urine output,or foam in urine  She is taking Ibuprofen for knee pain. Right knee pain getting worse. No recent trauma.  DM II: Dx'ed in 11/2014. She is on Jardiance 10 mg,Metformin 1000 mg bid,and Victoza 1.8 mg daily. Negative for polydipsia,polyuria, or polyphagia. Not checking BS regularly.  Lab Results  Component Value Date   HGBA1C 6.6 (H) 12/24/2018   Review of Systems  Constitutional: Negative for activity change, appetite change, fatigue and fever.  HENT: Negative for mouth sores, nosebleeds and sore throat.   Eyes: Negative for pain and redness.  Respiratory: Negative for cough and wheezing.   Gastrointestinal: Negative for abdominal pain, nausea and vomiting.       Negative for changes in bowel habits.  Musculoskeletal: Positive for arthralgias and joint swelling. Negative for myalgias.  Neurological: Negative for syncope and facial asymmetry.  Psychiatric/Behavioral: Negative for confusion. The patient is nervous/anxious.   Rest see pertinent positives and negatives per  HPI.   Current Outpatient Medications on File Prior to Visit  Medication Sig Dispense Refill  . amLODipine (NORVASC) 5 MG tablet TAKE 1 TABLET BY MOUTH  DAILY 90 tablet 3  . atorvastatin (LIPITOR) 20 MG tablet TAKE 1 TABLET BY MOUTH  DAILY WITH SUPPER 90 tablet 3  . BAYER MICROLET LANCETS lancets 1 each by Other route 3 (three) times daily. Use as instructed 300 each 3  . empagliflozin (JARDIANCE) 10 MG TABS tablet Take 10 mg by mouth daily. 90 tablet 2  . fenofibrate 54 MG tablet Take 1 tablet (54 mg total) by mouth daily. 90 tablet 0  . fluticasone (FLONASE) 50 MCG/ACT nasal spray SHAKE LIQUID AND USE 2 SPRAYS IN EACH NOSTRIL DAILY 16 g 6  . glucose blood (BAYER CONTOUR NEXT TEST) test strip 1 each by Other route 3 (three) times daily. Use as instructed 300 each 3  . hydrochlorothiazide (HYDRODIURIL) 12.5 MG tablet Take 1 tablet (12.5 mg total) by mouth daily. 30 tablet 3  . Insulin Pen Needle (AURORA PEN NEEDLES) 31G X 8 MM MISC Use once daily as directed 100 each 5  . levothyroxine (SYNTHROID) 100 MCG tablet TAKE 1 TABLET BY MOUTH  DAILY 90 tablet 3  . liraglutide (VICTOZA) 18 MG/3ML SOPN INJECT SUBCUTANEOUSLY 0.2ML (1.2MG  TOTAL) DAILY. 18 mL 2  . metFORMIN (GLUCOPHAGE) 1000 MG tablet TAKE 1 TABLET BY MOUTH TWO  TIMES DAILY WITH MEAL 180 tablet 2  . metoprolol succinate (TOPROL-XL) 50 MG 24 hr tablet TAKE 1 TABLET BY MOUTH  DAILY 90 tablet  3  . sertraline (ZOLOFT) 100 MG tablet TAKE 1 TABLET BY MOUTH  DAILY 90 tablet 3  . triamcinolone cream (KENALOG) 0.1 % Apply 1 application topically 2 (two) times daily as needed. 45 g 1   No current facility-administered medications on file prior to visit.     Past Medical History:  Diagnosis Date  . Depression   . Diabetes mellitus type 2, uncontrolled ([place]) 2008  . Hyperlipidemia   . Hypertension   . Hypothyroidism   . Nephrolithiasis    No Known Allergies  Social History   Socioeconomic History  . Marital status: Single    Spouse  name: Not on file  . Number of children: Not on file  . Years of education: Not on file  . Highest education level: Not on file  Occupational History  . Not on file  Tobacco Use  . Smoking status: Never Smoker  . Smokeless tobacco: Never Used  Substance and Sexual Activity  . Alcohol use: No    Alcohol/week: 0.0 standard drinks  . Drug use: No  . Sexual activity: Not on file  Other Topics Concern  . Not on file  Social History Narrative   Patient is divorced, she was married twice in the past. She has a son age 24, daughter age 41 who lives in [place], daughter age 43 that lives in [place]   Patient moved to [place] area from [place] 2008   Social Determinants of Health   Financial Resource Strain:   . Difficulty of Paying Living Expenses:   Food Insecurity:   . Worried About Charity fundraiser in the Last Year:   . Arboriculturist in the Last Year:   Transportation Needs:   . Film/video editor (Medical):   Marland Kitchen Lack of Transportation (Non-Medical):   Physical Activity:   . Days of Exercise per Week:   . Minutes of Exercise per Session:   Stress:   . Feeling of Stress :   Social Connections:   . Frequency of Communication with Friends and Family:   . Frequency of Social Gatherings with Friends and Family:   . Attends Religious Services:   . Active Member of Clubs or Organizations:   . Attends Archivist Meetings:   Marland Kitchen Marital Status:     Vitals:   08/06/19 1027  BP: (!) 150/90  Pulse: 91  Resp: 12  Temp: 97.8 F (36.6 C)  SpO2: 97%   Body mass index is 29.87 kg/m.  Physical Exam  Nursing note and vitals reviewed. Constitutional: She is oriented to person, place, and time. She appears well-developed. No distress.  HENT:  Head: Normocephalic and atraumatic.  Mouth/Throat: Oropharynx is clear and moist and mucous membranes are normal.  Eyes: Pupils are equal, round, and reactive to light. Conjunctivae are normal.  Cardiovascular: Normal  rate and regular rhythm.  No murmur heard. Pulses:      Dorsalis pedis pulses are 2+ on the right side and 2+ on the left side.  Respiratory: Effort normal and breath sounds normal. No respiratory distress.  GI: Soft. She exhibits no mass. There is no hepatomegaly. There is no abdominal tenderness.  Musculoskeletal:        General: No edema.     Comments: Antalgic gait.  Lymphadenopathy:    She has no cervical adenopathy.  Neurological: She is alert and oriented to person, place, and time. She has normal strength. No cranial nerve deficit.  Skin: Skin is warm. No rash  noted. No erythema.  Psychiatric: She has a normal mood and affect.  Well groomed, good eye contact.    ASSESSMENT AND PLAN:  Ms. Diesha was seen today for follow-up.  Diagnoses and all orders for this visit: Orders Placed This Encounter  Procedures  . Basic metabolic panel  . POC HgB A1c  . EKG 12-Lead   Lab Results  Component Value Date   HGBA1C 8.4 (A) 08/06/2019   EKG: SR,normal axis and intervals,no ST/T wave changes. ? IVCD.No significant changes when compared with EKG in 08/2012. BP re-checked: 180/80.  Hypertension Not well controlled. Possible complications of elevated BP discussed. Benazepril 10 mg added today. No changes in trest of antihypertensive meds. BMP in 7-10 days. BP check in 6 weeks..   Type II diabetes mellitus with neurological manifestations, uncontrolled (HCC) HgA1C is not at goal.  Regular exercise and healthy diet with avoidance of added sugar food intake is an important part of treatment and recommended. Annual eye exam, periodic dental and foot care recommended. F/U in 5-6 months   Chronic kidney disease (CKD), stage III (moderate) (HCC) Some side effects of NSAID's discussed. Adequate BP and glucose controlled. Low salt diet and adequate hydration.  Osteoarthritis of both knees Problem is not well controlled. Tylenol 500 mg 3 times per day and topical  Voltaren. Recommend discussing with ortho treatment options.   Return in about 6 weeks (around 09/17/2019) for HTN. BMP in 7-10 days.   Josiel Gahm G. [place], MD  [facility]. [place] office.   A few things to remember from today's visit:   Today Benazepril 10 mg added. No changes in rest of meds. Eventually we can try to wean off Metoprolol.  We need lab in 7-10 days.  Continue monitoring blood pressure at home.  Avoid Ibuprofen or Naproxen, just tylenol for pain. Topical diclofenac for now. You may want to discuss option for knee pain with ortho.  Jardiance increased from 10 mg to 25 mg.  Please be sure medication list is accurate. If a new problem present, please set up appointment sooner than planned today.

## 2019-08-07 ENCOUNTER — Encounter: Payer: Self-pay | Admitting: Family Medicine

## 2019-08-18 ENCOUNTER — Other Ambulatory Visit (INDEPENDENT_AMBULATORY_CARE_PROVIDER_SITE_OTHER): Payer: 59

## 2019-08-18 ENCOUNTER — Other Ambulatory Visit: Payer: Self-pay

## 2019-08-18 DIAGNOSIS — I10 Essential (primary) hypertension: Secondary | ICD-10-CM | POA: Diagnosis not present

## 2019-08-18 LAB — BASIC METABOLIC PANEL
BUN: 18 mg/dL (ref 6–23)
CO2: 23 mEq/L (ref 19–32)
Calcium: 9.3 mg/dL (ref 8.4–10.5)
Chloride: 106 mEq/L (ref 96–112)
Creatinine, Ser: 1.18 mg/dL (ref 0.40–1.20)
GFR: 45.11 mL/min — ABNORMAL LOW (ref >60.00)
Glucose, Bld: 193 mg/dL — ABNORMAL HIGH (ref 70–99)
Potassium: 3.9 mEq/L (ref 3.5–5.1)
Sodium: 140 mEq/L (ref 135–145)

## 2019-08-25 ENCOUNTER — Encounter: Payer: Self-pay | Admitting: Family Medicine

## 2019-09-11 ENCOUNTER — Other Ambulatory Visit: Payer: Self-pay | Admitting: Family Medicine

## 2019-09-11 DIAGNOSIS — N183 Chronic kidney disease, stage 3 unspecified: Secondary | ICD-10-CM

## 2019-09-22 ENCOUNTER — Other Ambulatory Visit: Payer: Self-pay | Admitting: Family Medicine

## 2019-09-22 DIAGNOSIS — I10 Essential (primary) hypertension: Secondary | ICD-10-CM

## 2019-09-22 DIAGNOSIS — E785 Hyperlipidemia, unspecified: Secondary | ICD-10-CM

## 2019-09-22 DIAGNOSIS — E1122 Type 2 diabetes mellitus with diabetic chronic kidney disease: Secondary | ICD-10-CM

## 2019-09-22 DIAGNOSIS — Z794 Long term (current) use of insulin: Secondary | ICD-10-CM

## 2019-10-08 ENCOUNTER — Ambulatory Visit: Payer: 59 | Admitting: Family Medicine

## 2019-10-08 ENCOUNTER — Encounter: Payer: Self-pay | Admitting: Family Medicine

## 2019-10-08 ENCOUNTER — Other Ambulatory Visit: Payer: Self-pay

## 2019-10-08 VITALS — BP 160/80 | HR 77 | Temp 97.5°F | Resp 12 | Ht 64.25 in | Wt 176.5 lb

## 2019-10-08 DIAGNOSIS — E1149 Type 2 diabetes mellitus with other diabetic neurological complication: Secondary | ICD-10-CM

## 2019-10-08 DIAGNOSIS — E1165 Type 2 diabetes mellitus with hyperglycemia: Secondary | ICD-10-CM

## 2019-10-08 DIAGNOSIS — I10 Essential (primary) hypertension: Secondary | ICD-10-CM | POA: Diagnosis not present

## 2019-10-08 DIAGNOSIS — IMO0002 Reserved for concepts with insufficient information to code with codable children: Secondary | ICD-10-CM

## 2019-10-08 DIAGNOSIS — N1831 Chronic kidney disease, stage 3a: Secondary | ICD-10-CM

## 2019-10-08 LAB — POCT GLYCOSYLATED HEMOGLOBIN (HGB A1C): Hemoglobin A1C: 6.9 % — AB (ref 4.0–5.6)

## 2019-10-08 MED ORDER — AMLODIPINE BESY-BENAZEPRIL HCL 5-10 MG PO CAPS: 1.0000 | ORAL_CAPSULE | Freq: Every day | ORAL | 2 refills | Status: DC

## 2019-10-08 NOTE — Assessment & Plan Note (Addendum)
HgA1C is now at goal. No changes in current management. Regular exercise and healthy diet with avoidance of added sugar food intake is an important part of treatment and recommended. Annual eye exam, periodic dental and foot care recommended.  She was instructed to let me know about daughter's thyroid Bx report. F/U in 5-6 months

## 2019-10-08 NOTE — Patient Instructions (Addendum)
A few things to remember from today's visit:    No changes today. Resume Benazepril, it is going to be in one tab with Amlodipine to take at bedtime. Continue monitoring blood pressure, goal under 140/90.  If you need refills please call your pharmacy. Do not use My Chart to request refills or for acute issues that need immediate attention.    Please be sure medication list is accurate. If a new problem present, please set up appointment sooner than planned today.

## 2019-10-08 NOTE — Assessment & Plan Note (Signed)
Renal function has been stable. Continue low salt diet and no NSAID's. Adequate hydration. Adequate BP controlled. Benazepril to resume.

## 2019-10-08 NOTE — Progress Notes (Addendum)
Chief Complaint  Patient presents with  . Follow-up    Diabetes follow-up    HPI: [name] is a 69 y.o. female, who is here today for 2-3 months follow up.  She was last seen on  was last seen on 08/06/19. HTN: Negative for severe/frequent headache, visual changes, chest pain, dyspnea, palpitation, claudication, focal weakness, or edema. Last visit Benazepril was added. She run out of medication a couple weeks ago. She is also on HCTZ 25 mg daily, Amlodipine 5 mg daily, and Metoprolol succinate 50 mg daily. While she was taking new med, BP < 140/90.  Lab Results  Component Value Date   CREATININE 1.18 08/18/2019   BUN 18 08/18/2019   NA 140 08/18/2019   K 3.9 08/18/2019   CL 106 08/18/2019   CO2 23 08/18/2019   DM II:Dx;ed in 11/2014.  Last visit Jardiance increased from 10 mg to 25 mg, tolerating medication well. She is also on Metformin  1000 mg bid and Victoza 1.2 mg daily. BS's 110's.  HgA1C 8.4 in 07/2019.  Her daughter recently underwent thyroid Bx, result is pending.  Review of Systems  Constitutional: Negative for activity change, appetite change and fever.  HENT: Negative for mouth sores, nosebleeds and sore throat.   Respiratory: Negative for cough and wheezing.   Gastrointestinal: Negative for abdominal pain, nausea and vomiting.       Negative for changes in bowel habits.  Genitourinary: Negative for decreased urine volume, dysuria and hematuria.  Musculoskeletal: Positive for arthralgias. Negative for myalgias.  Neurological: Negative for syncope, facial asymmetry and weakness.  Psychiatric/Behavioral: Negative for confusion. The patient is nervous/anxious.   Rest of ROS, see pertinent positives sand negatives in HPI  Current Outpatient Medications on File Prior to Visit  Medication Sig Dispense Refill  . atorvastatin (LIPITOR) 20 MG tablet TAKE 1 TABLET BY MOUTH  DAILY WITH SUPPER 90 tablet 3  . BAYER MICROLET LANCETS lancets 1 each by Other  route 3 (three) times daily. Use as instructed 300 each 3  . diclofenac Sodium (VOLTAREN) 1 % GEL Apply 4 g topically 4 (four) times daily. 150 g 1  . empagliflozin (JARDIANCE) 10 MG TABS tablet Take 10 mg by mouth daily. 90 tablet 2  . fluticasone (FLONASE) 50 MCG/ACT nasal spray SHAKE LIQUID AND USE 2 SPRAYS IN EACH NOSTRIL DAILY 16 g 6  . glucose blood (BAYER CONTOUR NEXT TEST) test strip 1 each by Other route 3 (three) times daily. Use as instructed 300 each 3  . hydrochlorothiazide (HYDRODIURIL) 12.5 MG tablet Take 1 tablet (12.5 mg total) by mouth daily. 30 tablet 3  . Insulin Pen Needle (AURORA PEN NEEDLES) 31G X 8 MM MISC Use once daily as directed 100 each 5  . levothyroxine (SYNTHROID) 100 MCG tablet TAKE 1 TABLET BY MOUTH  DAILY 90 tablet 3  . liraglutide (VICTOZA) 18 MG/3ML SOPN INJECT SUBCUTANEOUSLY 1.2MG  DAILY 18 mL 3  . metFORMIN (GLUCOPHAGE) 1000 MG tablet TAKE 1 TABLET BY MOUTH  TWICE DAILY WITH MEALS 180 tablet 3  . metoprolol succinate (TOPROL-XL) 50 MG 24 hr tablet TAKE 1 TABLET BY MOUTH  DAILY 90 tablet 3  . sertraline (ZOLOFT) 100 MG tablet TAKE 1 TABLET BY MOUTH  DAILY 90 tablet 3  . triamcinolone cream (KENALOG) 0.1 % Apply 1 application topically 2 (two) times daily as needed. 45 g 1   No current facility-administered medications on file prior to visit.   Past Medical History:  Diagnosis Date  .  Depression   . Diabetes mellitus type 2, uncontrolled (HCC) 2008  . Hyperlipidemia   . Hypertension   . Hypothyroidism   . Nephrolithiasis    No Known Allergies  Social History   Socioeconomic History  . Marital status: Single    Spouse name: Not on file  . Number of children: Not on file  . Years of education: Not on file  . Highest education level: Not on file  Occupational History  . Not on file  Tobacco Use  . Smoking status: Never Smoker  . Smokeless tobacco: Never Used  Substance and Sexual Activity  . Alcohol use: No    Alcohol/week: 0.0 standard  drinks  . Drug use: No  . Sexual activity: Not on file  Other Topics Concern  . Not on file  Social History Narrative   Patient is divorced, she was married twice in the past. She has a son age 50, daughter age 6 who lives in [place], daughter age 14 that lives in [place]   Patient moved to [place] area from [place] 2008   Social Determinants of Health   Financial Resource Strain:   . Difficulty of Paying Living Expenses:   Food Insecurity:   . Worried About Programme researcher, broadcasting/film/video in the Last Year:   . Barista in the Last Year:   Transportation Needs:   . Freight forwarder (Medical):   Marland Kitchen Lack of Transportation (Non-Medical):   Physical Activity:   . Days of Exercise per Week:   . Minutes of Exercise per Session:   Stress:   . Feeling of Stress :   Social Connections:   . Frequency of Communication with Friends and Family:   . Frequency of Social Gatherings with Friends and Family:   . Attends Religious Services:   . Active Member of Clubs or Organizations:   . Attends Banker Meetings:   Marland Kitchen Marital Status:     Vitals:   10/08/19 1023  BP: (!) 160/80  Pulse: 77  Resp: 12  Temp: (!) 97.5 F (36.4 C)  SpO2: 96%   Body mass index is 30.06 kg/m.  Physical Exam  Nursing note and vitals reviewed. Constitutional: She is oriented to person, place, and time. She appears well-developed. No distress.  HENT:  Head: Normocephalic and atraumatic.  Mouth/Throat: Mucous membranes are moist.  Eyes: Conjunctivae are normal.  Cardiovascular: Normal rate and regular rhythm.  No murmur heard. Pulses:      Dorsalis pedis pulses are 2+ on the right side and 2+ on the left side.  Respiratory: Effort normal and breath sounds normal. No respiratory distress.  GI: Soft. Normal appearance. She exhibits no mass. There is no hepatomegaly. There is no abdominal tenderness.  Lymphadenopathy:    She has no cervical adenopathy.  Neurological: She is alert and  oriented to person, place, and time. No cranial nerve deficit.  Antalgic gait, not assisted.  Skin: Skin is warm. No rash noted. No erythema.  Psychiatric:  Well groomed, good eye contact.   ASSESSMENT AND PLAN:  Ms. Dawn Huang was seen today for 2-3 months follow-up.  Orders Placed This Encounter  Procedures  . POC HgB A1c   Lab Results  Component Value Date   HGBA1C 6.9 (A) 10/08/2019   Type II diabetes mellitus with neurological manifestations, uncontrolled (HCC) HgA1C is now at goal. No changes in current management. Regular exercise and healthy diet with avoidance of added sugar food intake is an important part  of treatment and recommended. Annual eye exam, periodic dental and foot care recommended.  She was instructed to let me know about daughter's thyroid Bx report. F/U in 5-6 months   Hypertension BP not adequately controlled today. Possible complications of elevated BP discussed. Resumed Benazepril 10 mg daily. No changes in rest of her meds. Continue monitoring BP regularly.  Chronic kidney disease (CKD), stage III (moderate) (HCC) Renal function has been stable. Continue low salt diet and no NSAID's. Adequate hydration. Adequate BP controlled. Benazepril to resume.   Return in about 6 months (around 03/25/2020) for DM , HLD, HTN, and CPE.   Cathlene Gardella G. [place], MD  [facility]. [place] office.  A few things to remember from today's visit:   No changes today. Resume Benazepril, it is going to be in one tab with Amlodipine to take at bedtime. Continue monitoring blood pressure, goal under 140/90.  If you need refills please call your pharmacy. Do not use My Chart to request refills or for acute issues that need immediate attention.    Please be sure medication list is accurate. If a new problem present, please set up appointment sooner than planned today.

## 2019-10-08 NOTE — Assessment & Plan Note (Signed)
BP not adequately controlled today. Possible complications of elevated BP discussed. Resumed Benazepril 10 mg daily. No changes in rest of her meds. Continue monitoring BP regularly.

## 2019-12-01 ENCOUNTER — Telehealth: Payer: Self-pay | Admitting: Family Medicine

## 2019-12-01 DIAGNOSIS — E1149 Type 2 diabetes mellitus with other diabetic neurological complication: Secondary | ICD-10-CM

## 2019-12-01 DIAGNOSIS — IMO0002 Reserved for concepts with insufficient information to code with codable children: Secondary | ICD-10-CM

## 2019-12-01 MED ORDER — EMPAGLIFLOZIN 10 MG PO TABS: 10.0000 mg | ORAL_TABLET | Freq: Every day | ORAL | 2 refills | Status: DC

## 2019-12-01 NOTE — Telephone Encounter (Signed)
Rx sent in as requested. 

## 2019-12-01 NOTE — Telephone Encounter (Signed)
Pt is requesting a med refill  Medication: JARDIANCE  Pharmacy: [facility] [phone] - [place], [place] - [phone] AT [facility] [address] & [address] [facility] Phone:  [phone]  Fax:  [phone]

## 2020-03-16 ENCOUNTER — Encounter: Payer: Self-pay | Admitting: Family Medicine

## 2020-03-16 DIAGNOSIS — E1149 Type 2 diabetes mellitus with other diabetic neurological complication: Secondary | ICD-10-CM

## 2020-03-16 DIAGNOSIS — IMO0002 Reserved for concepts with insufficient information to code with codable children: Secondary | ICD-10-CM

## 2020-03-16 MED ORDER — EMPAGLIFLOZIN 10 MG PO TABS: 10.0000 mg | ORAL_TABLET | Freq: Every day | ORAL | 2 refills | Status: DC

## 2020-03-17 ENCOUNTER — Encounter: Payer: 59 | Admitting: Family Medicine

## 2020-05-12 ENCOUNTER — Telehealth: Payer: Self-pay | Admitting: Family Medicine

## 2020-05-12 NOTE — Telephone Encounter (Signed)
Needs appointment

## 2020-05-12 NOTE — Telephone Encounter (Signed)
Pt call and want something call in for her cough to  [id number] - [place], [place] - [address] AT [address] [facility] OF [address] & [address] Phone:  [phone]  Fax:  [phone]

## 2020-05-12 NOTE — Telephone Encounter (Signed)
LMVM and sent MyChart for the patient to contact the office to schedule a virtual visit with Dr. Swaziland for her cough. Dawn Huang can work her in the schedule this afternoon at 4:30

## 2020-05-14 NOTE — Telephone Encounter (Signed)
Pt called to check the status of a cough medication that she thought was suppose to be sent in for her.  Pt was not aware that she need to make the appointment on 05/12/2020 and made an appointment on 05/21/2020 not for the cough but for another reason.  Pt is aware that she will not be able to get a cough medication until she has the visit for her cough.

## 2020-05-14 NOTE — Telephone Encounter (Signed)
I left pt a voicemail letting her know I can still work her in today if she doesn't want to wait until next week to be seen for her cough. Advised her to call the office back to let us know - can add on at 4:30(virtually.)

## 2020-05-21 ENCOUNTER — Ambulatory Visit: Payer: 59 | Admitting: Family Medicine

## 2020-05-21 ENCOUNTER — Other Ambulatory Visit: Payer: Self-pay

## 2020-05-21 ENCOUNTER — Encounter: Payer: Self-pay | Admitting: Family Medicine

## 2020-05-21 ENCOUNTER — Ambulatory Visit (INDEPENDENT_AMBULATORY_CARE_PROVIDER_SITE_OTHER): Payer: 59

## 2020-05-21 VITALS — BP 118/70 | HR 79 | Resp 16 | Ht 64.25 in | Wt 173.4 lb

## 2020-05-21 DIAGNOSIS — R059 Cough, unspecified: Secondary | ICD-10-CM

## 2020-05-21 DIAGNOSIS — E782 Mixed hyperlipidemia: Secondary | ICD-10-CM | POA: Diagnosis not present

## 2020-05-21 DIAGNOSIS — I10 Essential (primary) hypertension: Secondary | ICD-10-CM

## 2020-05-21 DIAGNOSIS — E1165 Type 2 diabetes mellitus with hyperglycemia: Secondary | ICD-10-CM | POA: Diagnosis not present

## 2020-05-21 DIAGNOSIS — E559 Vitamin D deficiency, unspecified: Secondary | ICD-10-CM | POA: Diagnosis not present

## 2020-05-21 DIAGNOSIS — F324 Major depressive disorder, single episode, in partial remission: Secondary | ICD-10-CM

## 2020-05-21 DIAGNOSIS — N1832 Chronic kidney disease, stage 3b: Secondary | ICD-10-CM

## 2020-05-21 DIAGNOSIS — J019 Acute sinusitis, unspecified: Secondary | ICD-10-CM | POA: Diagnosis not present

## 2020-05-21 DIAGNOSIS — E538 Deficiency of other specified B group vitamins: Secondary | ICD-10-CM | POA: Diagnosis not present

## 2020-05-21 DIAGNOSIS — E1149 Type 2 diabetes mellitus with other diabetic neurological complication: Secondary | ICD-10-CM

## 2020-05-21 DIAGNOSIS — J989 Respiratory disorder, unspecified: Secondary | ICD-10-CM

## 2020-05-21 DIAGNOSIS — IMO0002 Reserved for concepts with insufficient information to code with codable children: Secondary | ICD-10-CM

## 2020-05-21 DIAGNOSIS — E039 Hypothyroidism, unspecified: Secondary | ICD-10-CM

## 2020-05-21 LAB — POCT GLYCOSYLATED HEMOGLOBIN (HGB A1C): HbA1c, POC (controlled diabetic range): 6.9 % (ref 0.0–7.0)

## 2020-05-21 LAB — COMPREHENSIVE METABOLIC PANEL
ALT: 19 U/L (ref 0–35)
AST: 20 U/L (ref 0–37)
Albumin: 4.3 g/dL (ref 3.5–5.2)
Alkaline Phosphatase: 61 U/L (ref 39–117)
BUN: 19 mg/dL (ref 6–23)
CO2: 25 mEq/L (ref 19–32)
Calcium: 9.3 mg/dL (ref 8.4–10.5)
Chloride: 103 mEq/L (ref 96–112)
Creatinine, Ser: 0.93 mg/dL (ref 0.40–1.20)
GFR: 61.59 mL/min (ref >60.00)
Glucose, Bld: 189 mg/dL — ABNORMAL HIGH (ref 70–99)
Potassium: 4.3 mEq/L (ref 3.5–5.1)
Sodium: 138 mEq/L (ref 135–145)
Total Bilirubin: 1.3 mg/dL — ABNORMAL HIGH (ref 0.2–1.2)
Total Protein: 7.1 g/dL (ref 6.0–8.3)

## 2020-05-21 LAB — VITAMIN D 25 HYDROXY (VIT D DEFICIENCY, FRACTURES): VITD: 23.97 ng/mL — ABNORMAL LOW (ref 30.00–100.00)

## 2020-05-21 LAB — LIPID PANEL
Cholesterol: 147 mg/dL (ref 0–200)
HDL: 51.5 mg/dL (ref >39.00)
LDL Cholesterol: 63 mg/dL (ref 0–99)
NonHDL: 95.95
Total CHOL/HDL Ratio: 3
Triglycerides: 166 mg/dL — ABNORMAL HIGH (ref 0.0–149.0)
VLDL: 33.2 mg/dL (ref 0.0–40.0)

## 2020-05-21 LAB — MICROALBUMIN / CREATININE URINE RATIO
Creatinine,U: 80.4 mg/dL
Microalb Creat Ratio: 3 mg/g (ref 0.0–30.0)
Microalb, Ur: 2.4 mg/dL — ABNORMAL HIGH (ref 0.0–1.9)

## 2020-05-21 LAB — TSH: TSH: 4.38 u[IU]/mL (ref 0.35–4.50)

## 2020-05-21 LAB — VITAMIN B12: Vitamin B-12: 633 pg/mL (ref 211–911)

## 2020-05-21 MED ORDER — AMOXICILLIN-POT CLAVULANATE 875-125 MG PO TABS: 1.0000 | ORAL_TABLET | Freq: Two times a day (BID) | ORAL | 0 refills | Status: AC

## 2020-05-21 MED ORDER — ALBUTEROL SULFATE HFA 108 (90 BASE) MCG/ACT IN AERS: 2.0000 | INHALATION_SPRAY | Freq: Four times a day (QID) | RESPIRATORY_TRACT | 0 refills | Status: AC | PRN

## 2020-05-21 MED ORDER — BENZONATATE 100 MG PO CAPS: 200.0000 mg | ORAL_CAPSULE | Freq: Two times a day (BID) | ORAL | 0 refills | Status: DC | PRN

## 2020-05-21 MED ORDER — AMLODIPINE BESY-BENAZEPRIL HCL 5-10 MG PO CAPS
1.0000 | ORAL_CAPSULE | Freq: Every day | ORAL | 2 refills | Status: AC
Start: 2020-05-21 — End: ?

## 2020-05-21 MED ORDER — SERTRALINE HCL 100 MG PO TABS: 100.0000 mg | ORAL_TABLET | Freq: Every day | ORAL | 3 refills | Status: AC

## 2020-05-21 NOTE — Assessment & Plan Note (Addendum)
Continue Atorvastatin 20 mg daily. Further recommendations according to FLP results.

## 2020-05-21 NOTE — Assessment & Plan Note (Signed)
Continue with daily B12 supplementation, dose will be adjusted, if needed,according to B12 result.

## 2020-05-21 NOTE — Assessment & Plan Note (Signed)
Continue Levothyroxine 100 mcg daily. We will adjust dose if needed and according to TSH result.

## 2020-05-21 NOTE — Patient Instructions (Addendum)
A few things to remember from today's visit:   Type II diabetes mellitus with neurological manifestations, uncontrolled (HCC) - Plan: POC HgB A1c, Comprehensive metabolic panel, Microalbumin / creatinine urine ratio  Stage 3b chronic kidney disease (HCC) - Plan: Comprehensive metabolic panel  Mixed hyperlipidemia - Plan: Comprehensive metabolic panel, Lipid panel  Hypothyroidism, unspecified type - Plan: TSH  Primary hypertension  B12 deficiency - Plan: Vitamin B12  Vitamin D deficiency - Plan: VITAMIN D 25 Hydroxy (Vit-D Deficiency, Fractures)  Major depressive disorder with single episode, in partial remission (HCC)  Reactive airway disease without asthma - Plan: albuterol (VENTOLIN HFA) 108 (90 Base) MCG/ACT inhaler  Cough - Plan: DG Chest 2 View  If you need refills please call your pharmacy. Do not use My Chart to request refills or for acute issues that need immediate attention.   Plain Mucinex daily may help with cough. Nasal saline irrigations. Flonase nasal spray to continue once daily.  Start antibiotic today but if not bacterial infection it will not help. Monitor for fever.  No changes in rest of meds.  Please be sure medication list is accurate. If a new problem present, please set up appointment sooner than planned today.

## 2020-05-21 NOTE — Progress Notes (Signed)
HPI: [name] is a 70 y.o. female, who is here today for 6 months follow up.   She was last seen on 10/08/19. No new problems since her last visit.  -DM II: Dx'ed in 11/2014. She decreased snacking. Planning on arranging appt with eye care provider. Negative for abdominal pain,vomiting, polydipsia,polyuria, or polyphagia. She is on Metformin 1000 mg bid, Victoza 1.2 mg daily,and Jardiance 25 mg daily.  HgA1C on 10/08/19 was 6.9. She is not checking BS's regularly.  Lab Results  Component Value Date   MICROALBUR 0.9 12/24/2018   MICROALBUR 1.1 05/21/2017   -She is taking B12 5000 mcg daily. It has been recommended in the past for low B12.  -Hypothyroidism: She is on Levothyroxine 100 mcg daily.  Lab Results  Component Value Date   TSH 2.75 05/21/2017   HTN: She checks BP occasionally. Negative for visual changes, chest pain, dyspnea, palpitation, focal weakness, or edema. She is on Metoprolol Succinate 50 mg daily,and Amlodipine-Benazepril 5-10 mg daily. HCTZ has not been filled sine 2018.  Lab Results  Component Value Date   CREATININE 1.18 08/18/2019   BUN 18 08/18/2019   NA 140 08/18/2019   K 3.9 08/18/2019   CL 106 08/18/2019   CO2 23 08/18/2019   Respiratory symptoms for "at least" 2 weeks. She has been with nasal congestion and cough. COVID 19 negative 2 weeks ago. + Hx of seasonal allergies. She is using Flonase nasal spray. Frontal and facial pressure.  Coughing spells. + Intermittent episodes of wheezing. No SOB. No hx of asthma or tobacco use.  No sick contact.  HLD: She is on Atorvastatin 20 mg daily.  Lab Results  Component Value Date   CHOL 152 12/24/2018   HDL 58.30 12/24/2018   LDLCALC 65 12/24/2018   LDLDIRECT 94.0 12/05/2017   TRIG 142.0 12/24/2018   CHOLHDL 3 12/24/2018   Vit D deficiency: She is not on vit D supplementation.  Depression: She is on Sertraline 100 mg daily. She has taken medication for years and still  helping.  Depression screen [facility] 2/9 05/21/2020 12/24/2018 07/28/2015 07/22/2014 05/26/2013  Decreased Interest 0 0 0 0 0  Down, Depressed, Hopeless 0 0 0 1 1  PHQ - 2 Score 0 0 0 1 1  Altered sleeping 1 0 - - -  Tired, decreased energy - 0 - - -  Change in appetite - 0 - - -  Feeling bad or failure about yourself  - 0 - - -  Trouble concentrating - 0 - - -  Moving slowly or fidgety/restless - 0 - - -  Suicidal thoughts - 0 - - -  PHQ-9 Score 1 0 - - -  Difficult doing work/chores - Not difficult at all - - -   Review of Systems  Constitutional: Positive for fatigue and unexpected weight change. Negative for activity change, appetite change and fever.  HENT: Positive for congestion. Negative for ear pain, mouth sores and sore throat.   Gastrointestinal: Negative for abdominal pain and nausea.       Negative for changes in bowel habits.  Endocrine: Negative for cold intolerance and heat intolerance.  Genitourinary: Negative for decreased urine volume, dysuria and hematuria.  Allergic/Immunologic: Positive for environmental allergies.  Neurological: Negative for syncope, facial asymmetry and weakness.  Psychiatric/Behavioral: Positive for sleep disturbance. Negative for confusion.  Rest of ROS, see pertinent positives sand negatives in HPI  Current Outpatient Medications on File Prior to Visit  Medication Sig Dispense  Refill  . atorvastatin (LIPITOR) 20 MG tablet TAKE 1 TABLET BY MOUTH  DAILY WITH SUPPER 90 tablet 3  . BAYER MICROLET LANCETS lancets 1 each by Other route 3 (three) times daily. Use as instructed 300 each 3  . diclofenac Sodium (VOLTAREN) 1 % GEL Apply 4 g topically 4 (four) times daily. 150 g 1  . empagliflozin (JARDIANCE) 10 MG TABS tablet Take 1 tablet (10 mg total) by mouth daily. 90 tablet 2  . fluticasone (FLONASE) 50 MCG/ACT nasal spray SHAKE LIQUID AND USE 2 SPRAYS IN EACH NOSTRIL DAILY 16 g 6  . glucose blood (BAYER CONTOUR NEXT TEST) test strip 1 each by Other route 3  (three) times daily. Use as instructed 300 each 3  . Insulin Pen Needle (AURORA PEN NEEDLES) 31G X 8 MM MISC Use once daily as directed 100 each 5  . levothyroxine (SYNTHROID) 100 MCG tablet TAKE 1 TABLET BY MOUTH  DAILY 90 tablet 3  . liraglutide (VICTOZA) 18 MG/3ML SOPN INJECT SUBCUTANEOUSLY 1.2MG  DAILY 18 mL 3  . metFORMIN (GLUCOPHAGE) 1000 MG tablet TAKE 1 TABLET BY MOUTH  TWICE DAILY WITH MEALS 180 tablet 3  . metoprolol succinate (TOPROL-XL) 50 MG 24 hr tablet TAKE 1 TABLET BY MOUTH  DAILY 90 tablet 3  . triamcinolone cream (KENALOG) 0.1 % Apply 1 application topically 2 (two) times daily as needed. 45 g 1   No current facility-administered medications on file prior to visit.   Past Medical History:  Diagnosis Date  . Depression   . Diabetes mellitus type 2, uncontrolled (HCC) 2008  . Hyperlipidemia   . Hypertension   . Hypothyroidism   . Nephrolithiasis    No Known Allergies  Social History   Socioeconomic History  . Marital status: Single    Spouse name: Not on file  . Number of children: Not on file  . Years of education: Not on file  . Highest education level: Not on file  Occupational History  . Not on file  Tobacco Use  . Smoking status: Never Smoker  . Smokeless tobacco: Never Used  Substance and Sexual Activity  . Alcohol use: No    Alcohol/week: 0.0 standard drinks  . Drug use: No  . Sexual activity: Not on file  Other Topics Concern  . Not on file  Social History Narrative   Patient is divorced, she was married twice in the past. She has a son age 24, daughter age 61 who lives in [place], daughter age 74 that lives in [place]   Patient moved to [place] area from [place] 2008   Social Determinants of Health   Financial Resource Strain: Not on [employer] Insecurity: Not on file  Transportation Needs: Not on file  Physical Activity: Not on file  Stress: Not on file  Social Connections: Not on file   Vitals:   05/21/20 0819  BP: 118/70  Pulse:  79  Resp: 16  SpO2: 97%   Wt Readings from Last 3 Encounters:  05/21/20 173 lb 6 oz (78.6 kg)  10/08/19 176 lb 8 oz (80.1 kg)  08/06/19 175 lb 6 oz (79.5 kg)   Body mass index is 29.53 kg/m.  Physical Exam Vitals and nursing note reviewed.  Constitutional:      General: She is not in acute distress.    Appearance: She is well-developed.  HENT:     Head: Normocephalic and atraumatic.     Nose: Congestion and rhinorrhea present.     Right Turbinates:  Not enlarged.     Left Turbinates: Not enlarged.     Right Sinus: Maxillary sinus tenderness and frontal sinus tenderness present.     Left Sinus: Maxillary sinus tenderness and frontal sinus tenderness present.     Comments: Hyperemic nasal mucosa.    Mouth/Throat:     Mouth: Oropharynx is clear and moist and mucous membranes are normal. Mucous membranes are moist.     Pharynx: Oropharynx is clear. Uvula midline.     Comments: Post nasal drainage. Nasal voice.  Eyes:     Conjunctiva/sclera: Conjunctivae normal.     Pupils: Pupils are equal, round, and reactive to light.  Cardiovascular:     Rate and Rhythm: Normal rate and regular rhythm.     Pulses:          Dorsalis pedis pulses are 2+ on the right side and 2+ on the left side.     Heart sounds: No murmur heard.   Pulmonary:     Effort: Pulmonary effort is normal. No respiratory distress.     Breath sounds: Normal breath sounds.  Abdominal:     Palpations: Abdomen is soft. There is no hepatomegaly or mass.     Tenderness: There is no abdominal tenderness.  Musculoskeletal:        General: No edema.  Lymphadenopathy:     Cervical: No cervical adenopathy.  Skin:    General: Skin is warm.     Findings: No erythema or rash.  Neurological:     General: No focal deficit present.     Mental Status: She is alert and oriented to person, place, and time.     Cranial Nerves: No cranial nerve deficit.     Gait: Gait normal.     Deep Tendon Reflexes: Strength normal.   Psychiatric:        Mood and Affect: Mood and affect normal.     Comments: Well groomed, good eye contact.   ASSESSMENT AND PLAN:  Ms. Dawn Huang was seen today for 6 months follow-up.  Orders Placed This Encounter  Procedures  . DG Chest 2 View  . Comprehensive metabolic panel  . Vitamin B12  . Lipid panel  . TSH  . Microalbumin / creatinine urine ratio  . VITAMIN D 25 Hydroxy (Vit-D Deficiency, Fractures)  . POC HgB A1c   Lab Results  Component Value Date   HGBA1C 6.9 05/21/2020   Lab Results  Component Value Date   CREATININE 0.93 05/21/2020   BUN 19 05/21/2020   NA 138 05/21/2020   K 4.3 05/21/2020   CL 103 05/21/2020   CO2 25 05/21/2020   Lab Results  Component Value Date   TSH 4.38 05/21/2020   Lab Results  Component Value Date   VITAMINB12 633 05/21/2020   Lab Results  Component Value Date   CHOL 147 05/21/2020   HDL 51.50 05/21/2020   LDLCALC 63 05/21/2020   LDLDIRECT 94.0 12/05/2017   TRIG 166.0 (H) 05/21/2020   CHOLHDL 3 05/21/2020   Lab Results  Component Value Date   ALT 19 05/21/2020   AST 20 05/21/2020   ALKPHOS 61 05/21/2020   BILITOT 1.3 (H) 05/21/2020   Acute non-recurrent sinusitis, unspecified location Augmentin 875-125 mg bid x 7 days recommended. Explained that symptoms may not be caused by bacterial infection but rather allergies and post viral synd, in which case abx will not help. Continue Flonase nasal spray daily prn and nasal saline irrigations as needed.  Reactive airway disease without  asthma No wheezing today. Albuterol inh 2 puff every 6 hours for a week then as needed for wheezing or shortness of breath.  I do not think oral Prednisone is needed at this time.  - albuterol (VENTOLIN HFA) 108 (90 Base) MCG/ACT inhaler; Inhale 2 puffs into the lungs every 6 (six) hours as needed for wheezing or shortness of breath.  Dispense: 8 g; Refill: 0  Cough Explained that cough can last a few days and even weeks after acute  illness have resolved. Benzonatate 100-200 mg tid prn may help as well as plain Mucinex. Further recommendations according to CXR report.  Chronic kidney disease (CKD), stage III (moderate) (HCC) Problem has been stable. Continue Benazepril. Low salt diet and adequate hydration. Continue avoiding NSAID's. Adequate BP and glucose controlled.  Hyperlipidemia Continue Atorvastatin 20 mg daily. Further recommendations according to FLP results.  Hypothyroidism Continue Levothyroxine 100 mcg daily. We will adjust dose if needed and according to TSH result.  Hypertension BP adequately controlled. No changes in amlodipine-Benazepril or Metoprolol succinate. Low salt diet. Continue monitoring BP regularly.  Vitamin D deficiency Recommend OTC Vit D 800 U daily. Further recommendations according to 25 [place] vit D results.  Depression Problem is well controlled. Continue Sertraline 100 mg daily.  B12 deficiency Continue with daily B12 supplementation, dose will be adjusted, if needed,according to B12 result.  Spent 42 minutes.  During this time history was obtained and documented, examination was performed, prior labs reviewed, and assessment/plan discussed. CXR: I do not appreciate infiltrates or opacifies.  Return in about 6 months (around 11/18/2020) for DM II,HTN.   Scorpio Fortin G. [place], MD  [facility]. [place] [facility].   A few things to remember from today's visit:   Type II diabetes mellitus with neurological manifestations, uncontrolled (HCC) - Plan: POC HgB A1c, Comprehensive metabolic panel, Microalbumin / creatinine urine ratio  Stage 3b chronic kidney disease (HCC) - Plan: Comprehensive metabolic panel  Mixed hyperlipidemia - Plan: Comprehensive metabolic panel, Lipid panel  Hypothyroidism, unspecified type - Plan: TSH  Primary hypertension  B12 deficiency - Plan: Vitamin B12  Vitamin D deficiency - Plan: VITAMIN D 25 Hydroxy (Vit-D Deficiency,  Fractures)  Major depressive disorder with single episode, in partial remission (HCC)  Reactive airway disease without asthma - Plan: albuterol (VENTOLIN HFA) 108 (90 Base) MCG/ACT inhaler  Cough - Plan: DG Chest 2 View  If you need refills please call your pharmacy. Do not use My Chart to request refills or for acute issues that need immediate attention.   Plain Mucinex daily may help with cough. Nasal saline irrigations. Flonase nasal spray to continue once daily.  Start antibiotic today but if not bacterial infection it will not help. Monitor for fever.  No changes in rest of meds.  Please be sure medication list is accurate. If a new problem present, please set up appointment sooner than planned today.

## 2020-05-21 NOTE — Assessment & Plan Note (Signed)
Recommend OTC Vit D 800 U daily. Further recommendations according to 25 [place] vit D results.

## 2020-05-21 NOTE — Assessment & Plan Note (Addendum)
BP adequately controlled. No changes in amlodipine-Benazepril or Metoprolol succinate. Low salt diet. Continue monitoring BP regularly.

## 2020-05-21 NOTE — Assessment & Plan Note (Signed)
Problem is well controlled. Continue Sertraline 100 mg daily.

## 2020-05-21 NOTE — Assessment & Plan Note (Signed)
Problem has been stable. Continue Benazepril. Low salt diet and adequate hydration. Continue avoiding NSAID's. Adequate BP and glucose controlled.

## 2020-05-28 ENCOUNTER — Ambulatory Visit
Admission: EM | Admit: 2020-05-28 | Discharge: 2020-05-28 | Disposition: A | Discharge status: Home / Self Care | Payer: 59 | Attending: Internal Medicine | Admitting: Internal Medicine

## 2020-05-28 ENCOUNTER — Other Ambulatory Visit: Payer: Self-pay

## 2020-05-28 DIAGNOSIS — R058 Other specified cough: Secondary | ICD-10-CM | POA: Diagnosis not present

## 2020-05-28 MED ORDER — HYDROCODONE-HOMATROPINE 5-1.5 MG/5ML PO SYRP: 5.0000 mL | ORAL_SOLUTION | Freq: Four times a day (QID) | ORAL | 0 refills | Status: DC | PRN

## 2020-05-28 NOTE — Discharge Instructions (Signed)
Please take medications as directed Use your inhalers If symptoms worsen please return to urgent care Your lung exam was normal so no chest x-ray is indicated.

## 2020-05-28 NOTE — ED Provider Notes (Addendum)
[facility]    CSN: [id number] Arrival date & time: 05/28/20  1745      History   Chief Complaint Chief Complaint  Patient presents with  . Cough    X 3 weeks  . Dizziness    X 1 week  . Fatigue    X 2 weeks    HPI Dawn Huang is a 70 y.o. female comes to the urgent care with 3-week history of cough, fatigue and intermittent dizziness.  Patient comes to urgent care after recent visit with her primary care physician.  She says cough is largely unproductive and if occasionally she gets sputum its tannish.  She has had some chills but no fever.  She used to have some wheezing but that has improved since she was given albuterol inhaler.  No chest pain or chest pressure.  He tested negative for COVID-19 a week and a half ago.  No body aches.  Patient denies any syncopal episodes.  Patient was given amoxicillin, albuterol inhaler and Tessalon as needed for cough.  Tessalon has not helped her cough much.  Recent labs done showed normal vitamin D level and TSH level.    HPI  Past Medical History:  Diagnosis Date  . Depression   . Diabetes mellitus type 2, uncontrolled (HCC) 2008  . Hyperlipidemia   . Hypertension   . Hypothyroidism   . Nephrolithiasis     Patient Active Problem List   Diagnosis Date Noted  . B12 deficiency 05/21/2020  . Osteoarthritis of both knees 08/06/2019  . Vitamin D deficiency 11/30/2015  . Chronic kidney disease (CKD), stage III (moderate) (HCC) 07/28/2015  . GERD (gastroesophageal reflux disease) 07/28/2015  . Chronic fatigue 06/08/2015  . Abdominal pain, epigastric 11/12/2013  . Type II diabetes mellitus with neurological manifestations, uncontrolled (HCC) 09/12/2012  . Hypothyroidism 09/12/2012  . Depression 09/12/2012  . Hyperlipidemia 09/12/2012  . Hypertension 09/12/2012    Past Surgical History:  Procedure Laterality Date  . IR GENERIC HISTORICAL  11/12/2015   IR VERTEBROPLASTY LUMBAR BX INC UNI/BIL INC/INJECT/IMAGING  11/12/2015 Julieanne Cotton, MD [facility]  . IR GENERIC HISTORICAL  11/02/2015   IR RADIOLOGIST EVAL & MGMT 11/02/2015 Simonne Come, MD [facility]  . TONSILLECTOMY  1968    OB History   No obstetric history on file.      Home Medications    Prior to Admission medications   Medication Sig Start Date End Date Taking? Authorizing Provider  albuterol (VENTOLIN HFA) 108 (90 Base) MCG/ACT inhaler Inhale 2 puffs into the lungs every 6 (six) hours as needed for wheezing or shortness of breath. 05/21/20  Yes [place], Betty G, MD  amLODipine-benazepril (LOTREL) 5-10 MG capsule Take 1 capsule by mouth daily. 05/21/20  Yes [place], Betty G, MD  amoxicillin-clavulanate (AUGMENTIN) 875-125 MG tablet Take 1 tablet by mouth 2 (two) times daily for 7 days. 05/21/20 05/28/20 Yes [place], Betty G, MD  atorvastatin (LIPITOR) 20 MG tablet TAKE 1 TABLET BY MOUTH  DAILY WITH SUPPER 09/23/19  Yes [place], Betty G, MD  BAYER MICROLET LANCETS lancets 1 each by Other route 3 (three) times daily. Use as instructed 07/22/14  Yes Yoo, Doe-Hyun R, DO  diclofenac Sodium (VOLTAREN) 1 % GEL Apply 4 g topically 4 (four) times daily. 08/06/19  Yes [place], Betty G, MD  empagliflozin (JARDIANCE) 10 MG TABS tablet Take 1 tablet (10 mg total) by mouth daily. 03/16/20  Yes [place], Betty G, MD  fluticasone [facility]) 50 MCG/ACT nasal spray SHAKE  LIQUID AND USE 2 SPRAYS IN EACH NOSTRIL DAILY 07/15/19  Yes Deeann Saint, MD  glucose blood (BAYER CONTOUR NEXT TEST) test strip 1 each by Other route 3 (three) times daily. Use as instructed 07/22/14  Yes Yoo, Doe-Hyun R, DO  HYDROcodone-homatropine (HYCODAN) 5-1.5 MG/5ML syrup Take 5 mLs by mouth every 6 (six) hours as needed for cough. 05/28/20  Yes Alekhya Gravlin, Britta Mccreedy, MD  Insulin Pen Needle (AURORA PEN NEEDLES) 31G X 8 MM MISC Use once daily as directed 06/08/15  Yes Nelwyn Salisbury, MD  levothyroxine (SYNTHROID) 100 MCG tablet TAKE 1 TABLET BY MOUTH  DAILY 07/15/19  Yes [place], Betty G, MD   liraglutide (VICTOZA) 18 MG/3ML SOPN INJECT SUBCUTANEOUSLY 1.2MG  DAILY 09/23/19  Yes [place], Betty G, MD  metFORMIN (GLUCOPHAGE) 1000 MG tablet TAKE 1 TABLET BY MOUTH  TWICE DAILY WITH MEALS 09/12/19  Yes [place], Betty G, MD  metoprolol succinate (TOPROL-XL) 50 MG 24 hr tablet TAKE 1 TABLET BY MOUTH  DAILY 09/23/19  Yes [place], Betty G, MD  sertraline (ZOLOFT) 100 MG tablet Take 1 tablet (100 mg total) by mouth daily. 05/21/20  Yes [place], Betty G, MD  triamcinolone cream (KENALOG) 0.1 % Apply 1 application topically 2 (two) times daily as needed. 07/16/19  Yes [place], Betty G, MD    Family History Family History  Problem Relation Age of Onset  . Coronary artery disease Father   . Coronary artery disease Brother   . Breast cancer Daughter   . Cancer Daughter        breast    Social History Social History   Tobacco Use  . Smoking status: Never Smoker  . Smokeless tobacco: Never Used  Vaping Use  . Vaping Use: Never used  Substance Use Topics  . Alcohol use: No    Alcohol/week: 0.0 standard drinks  . Drug use: No     Allergies   Patient has no known allergies.   Review of Systems Review of Systems  Constitutional: Negative.   HENT: Negative.  Negative for sore throat.   Respiratory: Positive for cough. Negative for chest tightness, shortness of breath and wheezing.   Cardiovascular: Negative for chest pain.  Gastrointestinal: Negative.   Neurological: Positive for dizziness and light-headedness. Negative for headaches.     Physical Exam Triage Vital Signs ED Triage Vitals  Enc Vitals Group     BP 05/28/20 1815 133/82     Pulse Rate 05/28/20 1815 72     Resp 05/28/20 1815 17     Temp 05/28/20 1815 98.2 F (36.8 C)     Temp Source 05/28/20 1815 Oral     SpO2 05/28/20 1815 97 %     Weight --      Height --      Head Circumference --      Peak Flow --      Pain Score 05/28/20 1811 0     Pain Loc --      Pain Edu? --      Excl. in GC? --    No data  found.  Updated Vital Signs BP 133/82 (BP Location: Right Arm)   Pulse 72   Temp 98.2 F (36.8 C) (Oral)   Resp 17   SpO2 97%   Visual Acuity Right Eye Distance:   Left Eye Distance:   Bilateral Distance:    Right Eye Near:   Left Eye Near:    Bilateral Near:     Physical Exam Vitals and nursing  note reviewed.  Constitutional:      General: She is not in acute distress.    Appearance: Normal appearance. She is not ill-appearing.  Cardiovascular:     Rate and Rhythm: Normal rate and regular rhythm.     Pulses: Normal pulses.     Heart sounds: Normal heart sounds.  Pulmonary:     Effort: Pulmonary effort is normal. No respiratory distress.     Breath sounds: Normal breath sounds. No wheezing, rhonchi or rales.  Chest:     Chest wall: No tenderness.  Abdominal:     General: Bowel sounds are normal.     Palpations: Abdomen is soft.  Neurological:     Mental Status: She is alert.      UC Treatments / Results  Labs (all labs ordered are listed, but only abnormal results are displayed) Labs Reviewed - No data to display  EKG   Radiology No results found.  Procedures Procedures (including critical care time)  Medications Ordered in UC Medications - No data to display  Initial Impression / Assessment and Plan / UC Course  I have reviewed the triage vital signs and the nursing notes.  Pertinent labs & imaging results that were available during my care of the patient were reviewed by me and considered in my medical decision making (see chart for details).     1.  Post viral cough syndrome: I contemplated giving patient steroids but she is diabetic and patient endorses elevated blood sugars so I will not start her on steroids. I discontinue Tessalon Perles Start Hycodan She was reassured that post viral syndrome eventually resolve Return precautions were given. Final Clinical Impressions(s) / UC Diagnoses   Final diagnoses:  Post-viral cough syndrome      Discharge Instructions     Please take medications as directed Use your inhalers If symptoms worsen please return to urgent care Your lung exam was normal so no chest x-ray is indicated.   ED Prescriptions    Medication Sig Dispense Auth. Provider   HYDROcodone-homatropine (HYCODAN) 5-1.5 MG/5ML syrup Take 5 mLs by mouth every 6 (six) hours as needed for cough. 120 mL Hartley Urton, Britta Mccreedy, MD     PDMP not reviewed this encounter.   Merrilee Jansky, MD 05/28/20 1942    Merrilee Jansky, MD 05/28/20 [phone]

## 2020-05-28 NOTE — ED Triage Notes (Signed)
Patient states she has had a cough x 3 weeks and fatigue for about 3 weeks as well. Pt also complains of dizziness/lightheadedness the past week as well. Pt was seen by her pcm on the 4th of February and given amoxicillin and was advised if it didn't fix her symptoms it was probably viral. Pt states she has gotten little relief from the meds.

## 2020-05-31 ENCOUNTER — Encounter: Payer: Self-pay | Admitting: Family Medicine

## 2020-08-04 ENCOUNTER — Ambulatory Visit: Payer: 59 | Admitting: Family Medicine

## 2021-01-26 ENCOUNTER — Other Ambulatory Visit: Payer: Self-pay | Admitting: Family Medicine

## 2021-06-21 NOTE — Progress Notes (Signed)
HPI: Dawn Huang is a 71 y.o. female, who is here today for chronic disease management.  Last seen on 05/21/20.  Diabetes Mellitus II: Dx'ed in 11/2014. - Checking BG at home: Not checking. - Medications: Metformin 1000 mg bid and Victoza 1.2 mg daily. - Diet: Eating more fruits, most of the time cooks at home.  - Exercise: Trying to walk more at work. - eye exam: 09/2020. - foot exam: due - Negative for symptoms of hypoglycemia, polyuria, polydipsia, numbness extremities, foot ulcers/trauma  Lab Results  Component Value Date   HGBA1C 6.9 05/21/2020   Lab Results  Component Value Date   MICROALBUR 2.4 (H) 05/21/2020   Hyperlipidemia: Currently on Atorvastatin 20 mg daily. Following a low fat diet: Cooking in a air fryer. Side effects from medication:none Lab Results  Component Value Date   CHOL 147 05/21/2020   HDL 51.50 05/21/2020   LDLCALC 63 05/21/2020   LDLDIRECT 94.0 12/05/2017   TRIG 166.0 (H) 05/21/2020   CHOLHDL 3 05/21/2020   Hypertension:  Medications:Amlodipine-Benazepril 5-10 mg daily, metoprolol succinate 50 mg daily. BP readings at home:Not checking. Side effects:none Negative for unusual or severe headache, visual changes, exertional chest pain, dyspnea,  focal weakness, or edema.  Lab Results  Component Value Date   CREATININE 0.93 05/21/2020   BUN 19 05/21/2020   NA 138 05/21/2020   K 4.3 05/21/2020   CL 103 05/21/2020   CO2 25 05/21/2020   Lab Results  Component Value Date   TSH 4.38 05/21/2020   "Overly tired all the time", falling asleep at work. Hx of chronic fatigue but it has been worse for the past couple months. She feels rested when she gets up.  Sleeping about 6-7 hours, taking naps sometimes. Trouble falling asleep and wakes up a few times.  "Severe heartburn all the time" for a couple of months. It is worse at night. Hx of GERD. She is not taking OTC medications. Baking soda and alka seltzer not longer helping. Epigastric  burning like pain, intermittently for a while. She has not identifies exacerbating or alleviating factors. No related with food intake.  Vit D def: She is on Vit D supplementations. B12 def: She is on b12 supplementation. Lab Results  Component Value Date   VITAMINB12 633 05/21/2020   Hand ezcema: Triamcinolone cream 0.1% daily as needed has helped.  Review of Systems  Constitutional:  Positive for fatigue. Negative for activity change, appetite change and fever.  HENT:  Negative for mouth sores, nosebleeds and trouble swallowing.   Eyes:  Negative for redness and visual disturbance.  Respiratory:  Negative for cough and wheezing.   Gastrointestinal:  Negative for nausea and vomiting.       Negative for changes in bowel habits.  Endocrine: Negative for cold intolerance and heat intolerance.  Genitourinary:  Negative for decreased urine volume, dysuria and hematuria.  Musculoskeletal:  Negative for gait problem and myalgias.  Skin:  Negative for wound.  Neurological:  Negative for syncope and facial asymmetry.  Psychiatric/Behavioral:  Positive for sleep disturbance. Negative for confusion.   Rest of ROS see pertinent positives and negatives in HPI.  Current Outpatient Medications on File Prior to Visit  Medication Sig Dispense Refill   albuterol (VENTOLIN HFA) 108 (90 Base) MCG/ACT inhaler Inhale 2 puffs into the lungs every 6 (six) hours as needed for wheezing or shortness of breath. 8 g 0   amLODipine-benazepril (LOTREL) 5-10 MG capsule Take 1 capsule by mouth daily. 90 capsule 2  atorvastatin (LIPITOR) 20 MG tablet TAKE 1 TABLET BY MOUTH  DAILY WITH SUPPER 90 tablet 3   BAYER MICROLET LANCETS lancets 1 each by Other route 3 (three) times daily. Use as instructed 300 each 3   diclofenac Sodium (VOLTAREN) 1 % GEL Apply 4 g topically 4 (four) times daily. 150 g 1   fluticasone (FLONASE) 50 MCG/ACT nasal spray SHAKE LIQUID AND USE 2 SPRAYS IN EACH NOSTRIL DAILY 16 g 6   glucose  blood (BAYER CONTOUR NEXT TEST) test strip 1 each by Other route 3 (three) times daily. Use as instructed 300 each 3   Insulin Pen Needle (AURORA PEN NEEDLES) 31G X 8 MM MISC Use once daily as directed 100 each 5   levothyroxine (SYNTHROID) 100 MCG tablet TAKE 1 TABLET BY MOUTH  DAILY 90 tablet 3   metFORMIN (GLUCOPHAGE) 1000 MG tablet TAKE 1 TABLET BY MOUTH  TWICE DAILY WITH MEALS 180 tablet 3   metoprolol succinate (TOPROL-XL) 50 MG 24 hr tablet TAKE 1 TABLET BY MOUTH  DAILY 90 tablet 3   sertraline (ZOLOFT) 100 MG tablet Take 1 tablet (100 mg total) by mouth daily. 90 tablet 3   No current facility-administered medications on file prior to visit.    Past Medical History:  Diagnosis Date   Depression    Diabetes mellitus type 2, uncontrolled 2008   Hyperlipidemia    Hypertension    Hypothyroidism    Nephrolithiasis    No Known Allergies  Social History   Socioeconomic History   Marital status: Single    Spouse name: Not on file   Number of children: Not on file   Years of education: Not on file   Highest education level: Not on file  Occupational History   Not on file  Tobacco Use   Smoking status: Never   Smokeless tobacco: Never  Vaping Use   Vaping Use: Never used  Substance and Sexual Activity   Alcohol use: No    Alcohol/week: 0.0 standard drinks   Drug use: No   Sexual activity: Not Currently  Other Topics Concern   Not on file  Social History Narrative   Patient is divorced, she was married twice in the past. She has a son age 33, daughter age 56 who lives in [place], daughter age 32 that lives in [place]   Patient moved to [place] area from [place]   Social Determinants of Health   Financial Resource Strain: Not on [employer] Insecurity: Not on file  Transportation Needs: Not on file  Physical Activity: Not on file  Stress: Not on file  Social Connections: Not on file   Vitals:   06/22/21 0809  BP: 130/80  Pulse: 81  Resp: 16  SpO2: 98%    Body mass index is 29.98 kg/m.  Physical Exam Vitals and nursing note reviewed.  Constitutional:      General: She is not in acute distress.    Appearance: She is well-developed.  HENT:     Head: Normocephalic and atraumatic.     Mouth/Throat:     Mouth: Mucous membranes are moist.     Pharynx: Oropharynx is clear.  Eyes:     Conjunctiva/sclera: Conjunctivae normal.  Cardiovascular:     Rate and Rhythm: Normal rate and regular rhythm.     Pulses:          Dorsalis pedis pulses are 2+ on the right side and 2+ on the left side.     Heart  sounds: No murmur heard. Pulmonary:     Effort: Pulmonary effort is normal. No respiratory distress.     Breath sounds: Normal breath sounds.  Abdominal:     Palpations: Abdomen is soft. There is no hepatomegaly or mass.     Tenderness: There is no abdominal tenderness.  Lymphadenopathy:     Cervical: No cervical adenopathy.  Skin:    General: Skin is warm.     Findings: No erythema or rash.  Neurological:     General: No focal deficit present.     Mental Status: She is alert and oriented to person, place, and time.     Cranial Nerves: No cranial nerve deficit.     Gait: Gait normal.  Psychiatric:     Comments: Well groomed, good eye contact.   Diabetic Foot Exam - Simple   Simple Foot Form Diabetic Foot exam was performed with the following findings: Yes 06/22/2021  8:58 PM  Visual Inspection No deformities, no ulcerations, no other skin breakdown bilaterally: Yes Sensation Testing Intact to touch and monofilament testing bilaterally: Yes Pulse Check Posterior Tibialis and Dorsalis pulse intact bilaterally: Yes Comments    ASSESSMENT AND PLAN:  Dawn Huang was seen today for follow-up.  Diagnoses and all orders for this visit: Orders Placed This Encounter  Procedures   Lipid panel   Basic Metabolic Panel   TSH   Microalbumin / creatinine urine ratio   Hepatic function panel   CBC   VITAMIN D 25 Hydroxy (Vit-D  Deficiency, Fractures)   Vitamin B12   LDL cholesterol, direct   POC HgB A1c   Lab Results  Component Value Date   HGBA1C 7.9 (A) 06/22/2021   Lab Results  Component Value Date   CREATININE 1.01 06/22/2021   BUN 18 06/22/2021   NA 141 06/22/2021   K 3.9 06/22/2021   CL 101 06/22/2021   CO2 29 06/22/2021   Lab Results  Component Value Date   VITAMINB12 861 06/22/2021   Lab Results  Component Value Date   ALT 17 06/22/2021   AST 17 06/22/2021   ALKPHOS 59 06/22/2021   BILITOT 1.7 (H) 06/22/2021   Lab Results  Component Value Date   CHOL 173 06/22/2021   HDL 67.80 06/22/2021   LDLCALC 63 05/21/2020   LDLDIRECT 82.0 06/22/2021   TRIG 214.0 (H) 06/22/2021   CHOLHDL 3 06/22/2021   Lab Results  Component Value Date   WBC 5.7 06/22/2021   HGB 14.1 06/22/2021   HCT 41.0 06/22/2021   MCV 90.5 06/22/2021   PLT 149.0 (L) 06/22/2021   Lab Results  Component Value Date   MICROALBUR 4.6 (H) 06/22/2021   MICROALBUR 2.4 (H) 05/21/2020   DM (diabetes mellitus), type 2 with renal complications ([place]) 123456 is not at goal, it went from 6.9 to 7.9. Jardiance dose increased from 10 mg to 25 mg daily. No changes in Voctoza or Metformin dose. Annual eye exam, periodic dental and foot care to continue. F/U in 4 months  Hypothyroidism Problem has been well controlled. Continue Levothyroxine 100 mcg daily. Further recommendations according to TSH result.  Hypertension BP adequately controlled. Continue Metoprolol succinate 50 mg daily and Amlodipine-Benazepril 5-10 mg daily. Monitor BP at home. Low salt diet to continue. Eye exam is current.  Major depressive disorder with single episode, in partial remission ([place]) Reporting problem as well controled. Continue Sertraline 100 mg daily.  B12 deficiency Last B12 was in normal range. Continue current dose of B12 supplementation, will adjust  if needed according to B12 result.  GERD (gastroesophageal reflux  disease) Problem is not well controlled. Omeprazole 40 mg sent to her pharmacy to take at bedtime for 8 weeks, then she can go down to 20 mg. GERD precautions to continue.  Vitamin D deficiency Continue current dose of Vit D supplementation. Further recommendations according to 25 OH vit D result.  Chronic fatigue She feels like it has been worse for the past couple months. We discussed possible etiologies, some of her chronic medical problems can be contributing factors. Regular physical activity and a healthful diet may help. Further recommendations according to lab results, we can consider a sleep study.  Chronic kidney disease (CKD), stage III (moderate) (HCC) Problem has been stable. Cr 0.9-1.3, e GFR 40's, last was 61. Adequate BP/glucose control and hydration. Continue low salt diet and avoidance of NSAID's.  Mixed hyperlipidemia Continue Atorvastatin 20 mg daily and low fat diet. Further recommendations according to FLP results.  The 10-year ASCVD risk score (Arnett DK, et al., 2019) is: 30.4%   Values used to calculate the score:     Age: 71 years     Sex: Female     Is Non-Hispanic African American: No     Diabetic: Yes     Tobacco smoker: No     Systolic Blood Pressure: [id number]     Is BP treated: Yes     HDL Cholesterol: 67.8 mg/dL     Total Cholesterol: 173 mg/dL   Atopic dermatitis Problem is well controlled. Continue Triamcinolone cream 0.1% 2 times per week at night, small amount.  I spent a total of 45 minutes in both face to face and non face to face activities for this visit on the date of this encounter. During this time history was obtained and documented, examination was performed, prior lab reviewed, and assessment/plan discussed. Recommend scheduling appt for her mammogram.  Return in about 4 months (around 10/22/2021) for DM II,HTN,CKD.  Tierney Behl G. [place], MD  [facility]. [place] office.

## 2021-06-22 ENCOUNTER — Encounter: Payer: Self-pay | Admitting: Family Medicine

## 2021-06-22 ENCOUNTER — Ambulatory Visit: Payer: 59 | Admitting: Family Medicine

## 2021-06-22 VITALS — BP 130/80 | HR 81 | Resp 16 | Ht 64.25 in | Wt 176.0 lb

## 2021-06-22 DIAGNOSIS — E782 Mixed hyperlipidemia: Secondary | ICD-10-CM | POA: Diagnosis not present

## 2021-06-22 DIAGNOSIS — N1832 Chronic kidney disease, stage 3b: Secondary | ICD-10-CM

## 2021-06-22 DIAGNOSIS — R5382 Chronic fatigue, unspecified: Secondary | ICD-10-CM

## 2021-06-22 DIAGNOSIS — Z794 Long term (current) use of insulin: Secondary | ICD-10-CM

## 2021-06-22 DIAGNOSIS — E559 Vitamin D deficiency, unspecified: Secondary | ICD-10-CM

## 2021-06-22 DIAGNOSIS — F324 Major depressive disorder, single episode, in partial remission: Secondary | ICD-10-CM | POA: Insufficient documentation

## 2021-06-22 DIAGNOSIS — L209 Atopic dermatitis, unspecified: Secondary | ICD-10-CM

## 2021-06-22 DIAGNOSIS — I10 Essential (primary) hypertension: Secondary | ICD-10-CM

## 2021-06-22 DIAGNOSIS — E1122 Type 2 diabetes mellitus with diabetic chronic kidney disease: Secondary | ICD-10-CM | POA: Diagnosis not present

## 2021-06-22 DIAGNOSIS — N1831 Chronic kidney disease, stage 3a: Secondary | ICD-10-CM

## 2021-06-22 DIAGNOSIS — F3341 Major depressive disorder, recurrent, in partial remission: Secondary | ICD-10-CM

## 2021-06-22 DIAGNOSIS — E538 Deficiency of other specified B group vitamins: Secondary | ICD-10-CM | POA: Diagnosis not present

## 2021-06-22 DIAGNOSIS — E039 Hypothyroidism, unspecified: Secondary | ICD-10-CM | POA: Diagnosis not present

## 2021-06-22 DIAGNOSIS — K219 Gastro-esophageal reflux disease without esophagitis: Secondary | ICD-10-CM

## 2021-06-22 LAB — HEPATIC FUNCTION PANEL
ALT: 17 U/L (ref 0–35)
AST: 17 U/L (ref 0–37)
Albumin: 4.7 g/dL (ref 3.5–5.2)
Alkaline Phosphatase: 59 U/L (ref 39–117)
Bilirubin, Direct: 0.3 mg/dL (ref 0.0–0.3)
Total Bilirubin: 1.7 mg/dL — ABNORMAL HIGH (ref 0.2–1.2)
Total Protein: 7.5 g/dL (ref 6.0–8.3)

## 2021-06-22 LAB — BASIC METABOLIC PANEL
BUN: 18 mg/dL (ref 6–23)
CO2: 29 mEq/L (ref 19–32)
Calcium: 9.9 mg/dL (ref 8.4–10.5)
Chloride: 101 mEq/L (ref 96–112)
Creatinine, Ser: 1.01 mg/dL (ref 0.40–1.20)
GFR: 55.36 mL/min — ABNORMAL LOW (ref >60.00)
Glucose, Bld: 172 mg/dL — ABNORMAL HIGH (ref 70–99)
Potassium: 3.9 mEq/L (ref 3.5–5.1)
Sodium: 141 mEq/L (ref 135–145)

## 2021-06-22 LAB — LIPID PANEL
Cholesterol: 173 mg/dL (ref 0–200)
HDL: 67.8 mg/dL (ref >39.00)
NonHDL: 105.04
Total CHOL/HDL Ratio: 3
Triglycerides: 214 mg/dL — ABNORMAL HIGH (ref 0.0–149.0)
VLDL: 42.8 mg/dL — ABNORMAL HIGH (ref 0.0–40.0)

## 2021-06-22 LAB — LDL CHOLESTEROL, DIRECT: Direct LDL: 82 mg/dL

## 2021-06-22 LAB — MICROALBUMIN / CREATININE URINE RATIO
Creatinine,U: 84.3 mg/dL
Microalb Creat Ratio: 5.4 mg/g (ref 0.0–30.0)
Microalb, Ur: 4.6 mg/dL — ABNORMAL HIGH (ref 0.0–1.9)

## 2021-06-22 LAB — TSH: TSH: 5.45 u[IU]/mL (ref 0.35–5.50)

## 2021-06-22 LAB — CBC
HCT: 41 % (ref 36.0–46.0)
Hemoglobin: 14.1 g/dL (ref 12.0–15.0)
MCHC: 34.5 g/dL (ref 30.0–36.0)
MCV: 90.5 fl (ref 78.0–100.0)
Platelets: 149 10*3/uL — ABNORMAL LOW (ref 150.0–400.0)
RBC: 4.53 Mil/uL (ref 3.87–5.11)
RDW: 13.9 % (ref 11.5–15.5)
WBC: 5.7 10*3/uL (ref 4.0–10.5)

## 2021-06-22 LAB — POCT GLYCOSYLATED HEMOGLOBIN (HGB A1C): Hemoglobin A1C: 7.9 % — AB (ref 4.0–5.6)

## 2021-06-22 LAB — VITAMIN B12: Vitamin B-12: 861 pg/mL (ref 211–911)

## 2021-06-22 LAB — VITAMIN D 25 HYDROXY (VIT D DEFICIENCY, FRACTURES): VITD: 65.28 ng/mL (ref 30.00–100.00)

## 2021-06-22 MED ORDER — LEVOTHYROXINE SODIUM 100 MCG PO TABS: 100.0000 ug | ORAL_TABLET | Freq: Every day | ORAL | 1 refills | Status: DC

## 2021-06-22 MED ORDER — ATORVASTATIN CALCIUM 20 MG PO TABS: 20.0000 mg | ORAL_TABLET | Freq: Every day | ORAL | 3 refills | Status: DC

## 2021-06-22 MED ORDER — OMEPRAZOLE 40 MG PO CPDR: 40.0000 mg | DELAYED_RELEASE_CAPSULE | Freq: Every day | ORAL | 1 refills | Status: DC

## 2021-06-22 MED ORDER — EMPAGLIFLOZIN 25 MG PO TABS: 25.0000 mg | ORAL_TABLET | Freq: Every day | ORAL | 1 refills | Status: DC

## 2021-06-22 MED ORDER — TRIAMCINOLONE ACETONIDE 0.1 % EX CREA: 1.0000 "application " | TOPICAL_CREAM | Freq: Two times a day (BID) | CUTANEOUS | 1 refills | Status: DC | PRN

## 2021-06-22 MED ORDER — VICTOZA 18 MG/3ML ~~LOC~~ SOPN: PEN_INJECTOR | SUBCUTANEOUS | 2 refills | Status: DC

## 2021-06-22 MED ORDER — METFORMIN HCL 1000 MG PO TABS: ORAL_TABLET | ORAL | 2 refills | Status: DC

## 2021-06-22 NOTE — Assessment & Plan Note (Signed)
Continue current dose of Vit D supplementation. Further recommendations according to 25 OH vit D result. 

## 2021-06-22 NOTE — Assessment & Plan Note (Signed)
Reporting problem as well controled. ?Continue Sertraline 100 mg daily. ?

## 2021-06-22 NOTE — Assessment & Plan Note (Signed)
Last B12 was in normal range. ?Continue current dose of B12 supplementation, will adjust if needed according to B12 result. ?

## 2021-06-22 NOTE — Assessment & Plan Note (Addendum)
HgA1C is not at goal, it went from 6.9 to 7.9. ?Jardiance dose increased from 10 mg to 25 mg daily. ?No changes in Voctoza or Metformin dose. ?Annual eye exam, periodic dental and foot care to continue. ?F/U in 4 months ?

## 2021-06-22 NOTE — Assessment & Plan Note (Signed)
Problem is well controlled. ?Continue Triamcinolone cream 0.1% 2 times per week at night, small amount. ?

## 2021-06-22 NOTE — Patient Instructions (Addendum)
A few things to remember from today's visit: ? ?Mixed hyperlipidemia - Plan: Lipid panel, Hepatic function panel ? ?Hypothyroidism, unspecified type - Plan: TSH ? ?Primary hypertension - Plan: Basic Metabolic Panel ? ?Type 2 diabetes mellitus with stage 3a chronic kidney disease, with long-term current use of insulin ([place]) - Plan: POC HgB A1c, Microalbumin / creatinine urine ratio ? ?Vitamin D deficiency - Plan: VITAMIN D 25 Hydroxy (Vit-D Deficiency, Fractures) ? ?Chronic fatigue - Plan: CBC ? ?Atopic dermatitis, unspecified type - Plan: triamcinolone cream (KENALOG) 0.1 % ? ?B12 deficiency - Plan: Vitamin B12 ? ?If you need refills please call your pharmacy. ?Do not use My Chart to request refills or for acute issues that need immediate attention. ?  ?Please be sure medication list is accurate. ?If a new problem present, please set up appointment sooner than planned today. ? ?Jardiance increased from 10 mg to 25 mg. ?Use small amount of triamcinolone on affected areas of hands at night and put a cotton glove 2 times per week to prevent exacerbations. ? ?

## 2021-06-22 NOTE — Assessment & Plan Note (Addendum)
Problem has been well controlled. ?Continue Levothyroxine 100 mcg daily, last prescription was sent in 06/2019. ?Further recommendations according to TSH result. ?

## 2021-06-22 NOTE — Assessment & Plan Note (Addendum)
Problem has been stable. ?Cr 0.9-1.3, e GFR 40's, last was 61. ?Adequate BP/glucose control and hydration. ?Continue low salt diet and avoidance of NSAID's. ?

## 2021-06-22 NOTE — Assessment & Plan Note (Signed)
Problem is not well controlled. ?Omeprazole 40 mg sent to her pharmacy to take at bedtime for 8 weeks, then she can go down to 20 mg. ?GERD precautions to continue. ?

## 2021-06-22 NOTE — Assessment & Plan Note (Signed)
BP adequately controlled. ?Continue Metoprolol succinate 50 mg daily and Amlodipine-Benazepril 5-10 mg daily. ?Monitor BP at home. ?Low salt diet to continue. ?Eye exam is current. ?

## 2021-06-22 NOTE — Assessment & Plan Note (Addendum)
Continue Atorvastatin 20 mg daily and low fat diet. ?Further recommendations according to FLP results. ? ?The 10-year ASCVD risk score (Arnett DK, et al., 2019) is: 30.4% ?  Values used to calculate the score: ?    Age: 71 years ?    Sex: Female ?    Is Non-Hispanic African American: No ?    Diabetic: Yes ?    Tobacco smoker: No ?    Systolic Blood Pressure: 130 mmHg ?    Is BP treated: Yes ?    HDL Cholesterol: 67.8 mg/dL ?    Total Cholesterol: 173 mg/dL ? ?

## 2021-06-22 NOTE — Assessment & Plan Note (Signed)
She feels like it has been worse for the past couple months. ?We discussed possible etiologies, some of her chronic medical problems can be contributing factors. ?Regular physical activity and a healthful diet may help. ?Further recommendations according to lab results, we can consider a sleep study. ?

## 2021-07-04 ENCOUNTER — Other Ambulatory Visit: Payer: Self-pay | Admitting: Family Medicine

## 2021-08-05 ENCOUNTER — Other Ambulatory Visit: Payer: Self-pay | Admitting: Family Medicine

## 2021-08-05 DIAGNOSIS — K219 Gastro-esophageal reflux disease without esophagitis: Secondary | ICD-10-CM

## 2021-10-23 ENCOUNTER — Other Ambulatory Visit: Payer: Self-pay | Admitting: Family Medicine

## 2021-10-23 DIAGNOSIS — E039 Hypothyroidism, unspecified: Secondary | ICD-10-CM

## 2021-11-08 DIAGNOSIS — Z13820 Encounter for screening for osteoporosis: Secondary | ICD-10-CM

## 2021-11-08 DIAGNOSIS — Z1231 Encounter for screening mammogram for malignant neoplasm of breast: Secondary | ICD-10-CM

## 2021-11-09 NOTE — Addendum Note (Signed)
Addended by: Claudette Laws D on: 11/09/2021 01:39 PM   Modules accepted: Orders

## 2021-11-13 ENCOUNTER — Other Ambulatory Visit: Payer: Self-pay | Admitting: Family Medicine

## 2021-11-13 DIAGNOSIS — Z794 Long term (current) use of insulin: Secondary | ICD-10-CM

## 2021-11-25 ENCOUNTER — Ambulatory Visit
Admission: RE | Admit: 2021-11-25 | Discharge: 2021-11-25 | Disposition: A | Discharge status: Home / Self Care | Payer: 59 | Source: Ambulatory Visit | Attending: Family Medicine | Admitting: Family Medicine

## 2021-11-25 DIAGNOSIS — Z1231 Encounter for screening mammogram for malignant neoplasm of breast: Secondary | ICD-10-CM

## 2022-01-25 ENCOUNTER — Telehealth: Payer: Self-pay | Admitting: Family Medicine

## 2022-01-25 DIAGNOSIS — L209 Atopic dermatitis, unspecified: Secondary | ICD-10-CM

## 2022-01-25 MED ORDER — TRIAMCINOLONE ACETONIDE 0.1 % EX CREA: 1.0000 | TOPICAL_CREAM | Freq: Two times a day (BID) | CUTANEOUS | 1 refills | Status: DC | PRN

## 2022-01-25 NOTE — Telephone Encounter (Signed)
Rx sent in as requested. 

## 2022-01-25 NOTE — Telephone Encounter (Signed)
Refill of triamcinolone cream (KENALOG) 0.1 %   [facility] ([place], [place] Phone:  [phone]  Fax:  [phone]

## 2022-02-03 ENCOUNTER — Other Ambulatory Visit: Payer: Self-pay | Admitting: Family Medicine

## 2022-02-03 DIAGNOSIS — Z794 Long term (current) use of insulin: Secondary | ICD-10-CM

## 2022-02-03 DIAGNOSIS — N1831 Chronic kidney disease, stage 3a: Secondary | ICD-10-CM

## 2022-02-03 MED ORDER — VICTOZA 18 MG/3ML ~~LOC~~ SOPN: PEN_INJECTOR | SUBCUTANEOUS | 1 refills | Status: AC

## 2022-02-03 NOTE — Addendum Note (Signed)
Addended by: Kathia Covington E on: 02/03/2022 08:48 AM   Modules accepted: Orders  

## 2022-04-14 ENCOUNTER — Other Ambulatory Visit: Payer: Self-pay | Admitting: Family Medicine

## 2022-04-14 DIAGNOSIS — E782 Mixed hyperlipidemia: Secondary | ICD-10-CM

## 2022-07-18 NOTE — Progress Notes (Signed)
HPI: [name] is a 72 y.o. female with past medical history significant for DM 2, hypertension, CKD 3, hyperlipidemia, depression, OSA, and hypothyroidism here today for her routine physical. She also needs a follow up on her chronic medical conditions and would like to address other concerns: Headache and diarrhea.  Last CPE: 06/22/21 She retired in November 2023. She admits that she is not exercising regularly but she is busy around her house with chores. In general she reports that she is eating better, she is not eating a lot of sweets or snacking frequently. She does not eat vegetables daily and she limits red meat intake. In regard to sleep, she is sleeping about 4 hours daily.  She goes to bed after midnight. No frequent alcohol intake and she never smoked.  Immunization History  Administered Date(s) Administered   Fluad Quad(high Dose 65+) 12/24/2018   Influenza,inj,Quad PF,6+ Mos 05/26/2013   Influenza-Unspecified 01/19/2014, 01/30/2015, 12/22/2020   PFIZER(Purple Top)SARS-COV-2 Vaccination 08/13/2019, 09/03/2019   Pneumococcal Conjugate-13 01/23/2013   Pneumococcal Polysaccharide-23 09/12/2012, 12/05/2017   Tdap 09/12/2012, 10/08/2020   Zoster, Live 05/26/2013   Health Maintenance  Topic Date Due   Medicare Annual Wellness (AWV)  Never done   Zoster Vaccines- Shingrix (1 of 2) Never done   DEXA SCAN  Never done   OPHTHALMOLOGY EXAM  09/15/2021   HEMOGLOBIN A1C  12/23/2021   Diabetic kidney evaluation - eGFR measurement  06/23/2022   Diabetic kidney evaluation - Urine ACR  06/23/2022   COVID-19 Vaccine (3 - 2023-24 season) 08/03/2022 (Originally 12/16/2021)   INFLUENZA VACCINE  11/16/2022   FOOT EXAM  07/21/2023   MAMMOGRAM  11/26/2023   COLONOSCOPY (Pts 45-9yrs Insurance coverage will need to be confirmed)  07/27/2025   DTaP/Tdap/Td (3 - Td or Tdap) 10/09/2030   Pneumonia Vaccine 63+ Years old  Completed   Hepatitis C Screening  Completed   HPV VACCINES  Aged  Out   -DM 2:Dx'ed in 11/2014.  Currently she is on Jardiance 25 mg daily, Victoza 1.2 mg [place] daily, and metformin at 1000 mg twice daily. Occasional tingling sensation around left great toe. Negative for polyuria, polydipsia, polyphagia.  -CKD 3: She has not noted gross hematuria, foam in urine, or decreased urine output.  Lab Results  Component Value Date   HGBA1C 7.9 (A) 06/22/2021   Lab Results  Component Value Date   MICROALBUR 4.6 (H) 06/22/2021   MICROALBUR 2.4 (H) 05/21/2020   -Hypertension: Today BP is elevated. She is not monitoring BP at home. Currently she is on amlodipine-benazepril 5-10 mg daily and metoprolol succinate 50 mg daily. Negative for CP, dyspnea, palpitations, orthopnea, PND, or edema.  -Hyperlipidemia: She is on atorvastatin 20 mg daily.  Lab Results  Component Value Date   CHOL 173 06/22/2021   HDL 67.80 06/22/2021   LDLCALC 63 05/21/2020   LDLDIRECT 82.0 06/22/2021   TRIG 214.0 (H) 06/22/2021   CHOLHDL 3 06/22/2021   -Hypothyroidism: Currently she is on levothyroxine 100 mcg daily. Last TSH was 5.4 on 06/22/2021.  Vitamin D deficiency: Currently she is on vitamin D supplementation. Last 25 OH vitamin D was 65.2.  C/O  persistent left-sided occipital headache, extending around left temporal area experienced daily. There are no accompanying symptoms of nausea, vomiting, or visual changes. She has identified exacerbating or alleviating factors. Intermittent back pain is also reported, which the patient associates with their sleeping position.  Pain is not radiated. Negative for saddle anesthesia or changes in bowel/bladder function.  She is  complaining of "severe" diarrhea with episodes of urgency and incontinence for the past few months. Episodes occurring shortly after eating, irrespective of the food type. There is no associated abdominal pain, nausea, or vomiting. Furthermore, the patient experiences periodic episodes of gas, bloating, and  belching, which tend to last for two to three days.  Negative for melena, mucus or blood in stool. She is concerned about possible parasites. No recent antibiotic use or consumption of suspicious food. No recent travel and no sick contact.  Review of Systems  Constitutional:  Positive for fatigue. Negative for appetite change and fever.  HENT:  Negative for hearing loss, mouth sores, sore throat and trouble swallowing.   Eyes:  Negative for redness and visual disturbance.  Respiratory:  Negative for cough and wheezing.   Cardiovascular:  Negative for chest pain and leg swelling.  Gastrointestinal:  Negative for abdominal pain, nausea and vomiting.  Endocrine: Negative for cold intolerance and heat intolerance.  Genitourinary:  Negative for decreased urine volume, dysuria, hematuria, vaginal bleeding and vaginal discharge.  Musculoskeletal:  Positive for arthralgias and back pain. Negative for gait problem.  Skin:  Negative for color change and rash.  Allergic/Immunologic: Positive for environmental allergies.  Neurological:  Positive for headaches. Negative for tremors, syncope and weakness.  Hematological:  Negative for adenopathy. Does not bruise/bleed easily.  Psychiatric/Behavioral:  Positive for sleep disturbance. Negative for confusion and hallucinations.   All other systems reviewed and are negative.  Current Outpatient Medications on File Prior to Visit  Medication Sig Dispense Refill   albuterol (VENTOLIN HFA) 108 (90 Base) MCG/ACT inhaler Inhale 2 puffs into the lungs every 6 (six) hours as needed for wheezing or shortness of breath. 8 g 0   amLODipine-benazepril (LOTREL) 5-10 MG capsule Take 1 capsule by mouth daily. 90 capsule 2   atorvastatin (LIPITOR) 20 MG tablet TAKE 1 TABLET BY MOUTH DAILY  WITH SUPPER 90 tablet 3   BAYER MICROLET LANCETS lancets 1 each by Other route 3 (three) times daily. Use as instructed 300 each 3   diclofenac Sodium (VOLTAREN) 1 % GEL Apply 4 g  topically 4 (four) times daily. 150 g 1   fluticasone (FLONASE) 50 MCG/ACT nasal spray SHAKE LIQUID AND USE 2 SPRAYS IN EACH NOSTRIL DAILY 16 g 6   glucose blood (BAYER CONTOUR NEXT TEST) test strip 1 each by Other route 3 (three) times daily. Use as instructed 300 each 3   Insulin Pen Needle (AURORA PEN NEEDLES) 31G X 8 MM MISC Use once daily as directed 100 each 5   JARDIANCE 25 MG TABS tablet TAKE 1 TABLET BY MOUTH DAILY  BEFORE BREAKFAST 90 tablet 3   levothyroxine (SYNTHROID) 100 MCG tablet TAKE 1 TABLET BY MOUTH DAILY 90 tablet 3   liraglutide (VICTOZA) 18 MG/3ML SOPN INJECT SUBCUTANEOUSLY 1.2 MG  DAILY 18 mL 1   metFORMIN (GLUCOPHAGE) 1000 MG tablet TAKE 1 TABLET BY MOUTH TWICE  DAILY WITH MEALS 180 tablet 1   metoprolol succinate (TOPROL-XL) 50 MG 24 hr tablet TAKE 1 TABLET BY MOUTH  DAILY 90 tablet 3   omeprazole (PRILOSEC) 40 MG capsule TAKE 1 CAPSULE BY MOUTH AT  BEDTIME 90 capsule 3   sertraline (ZOLOFT) 100 MG tablet Take 1 tablet (100 mg total) by mouth daily. 90 tablet 3   triamcinolone cream (KENALOG) 0.1 % Apply 1 Application topically 2 (two) times daily as needed. 45 g 1   No current facility-administered medications on file prior to visit.   Past  Medical History:  Diagnosis Date   Depression    Diabetes mellitus type 2, uncontrolled 2008   Hyperlipidemia    Hypertension    Hypothyroidism    Nephrolithiasis    Past Surgical History:  Procedure Laterality Date   IR GENERIC HISTORICAL  11/12/2015   IR VERTEBROPLASTY LUMBAR BX INC UNI/BIL INC/INJECT/IMAGING 11/12/2015 Julieanne Cotton, MD [facility]   IR GENERIC HISTORICAL  11/02/2015   IR RADIOLOGIST EVAL & MGMT 11/02/2015 Simonne Come, MD [facility]   TONSILLECTOMY  1968   No Known Allergies  Family History  Problem Relation Age of Onset   Coronary artery disease Father    Coronary artery disease Brother    Breast cancer Daughter    Cancer Daughter        breast   Social History   Socioeconomic  History   Marital status: Single    Spouse name: Not on file   Number of children: Not on file   Years of education: Not on file   Highest education level: Not on file  Occupational History   Not on file  Tobacco Use   Smoking status: Never   Smokeless tobacco: Never  Vaping Use   Vaping Use: Never used  Substance and Sexual Activity   Alcohol use: No    Alcohol/week: 0.0 standard drinks of alcohol   Drug use: No   Sexual activity: Not Currently  Other Topics Concern   Not on file  Social History Narrative   Patient is divorced, she was married twice in the past. She has a son age 39, daughter age 45 who lives in [place], daughter age 91 that lives in [place]   Patient moved to [place] area from [place] 2008   Social Determinants of Health   Financial Resource Strain: Not on file  Food Insecurity: Not on file  Transportation Needs: Not on file  Physical Activity: Not on file  Stress: Not on file  Social Connections: Not on file   Vitals:   07/21/22 0701 07/21/22 0731  BP: (!) 176/82 (!) 148/80  Pulse: 67   Resp: 16   Temp: 98 F (36.7 C)   SpO2: 98%   Body mass index is 29.23 kg/m. Wt Readings from Last 3 Encounters:  07/21/22 171 lb 3.2 oz (77.7 kg)  06/22/21 176 lb (79.8 kg)  05/21/20 173 lb 6 oz (78.6 kg)   Physical Exam Vitals and nursing note reviewed.  Constitutional:      General: She is not in acute distress.    Appearance: She is well-developed.  HENT:     Head: Normocephalic and atraumatic.     Right Ear: Hearing, tympanic membrane, ear canal and external ear normal.     Left Ear: Hearing, tympanic membrane, ear canal and external ear normal.     Mouth/Throat:     Mouth: Mucous membranes are moist.     Pharynx: Oropharynx is clear. Uvula midline.  Eyes:     Extraocular Movements: Extraocular movements intact.     Conjunctiva/sclera: Conjunctivae normal.     Pupils: Pupils are equal, round, and reactive to light.  Neck:     Thyroid: No  thyromegaly.     Trachea: No tracheal deviation.  Cardiovascular:     Rate and Rhythm: Normal rate and regular rhythm.     Pulses:          Dorsalis pedis pulses are 2+ on the right side and 2+ on the left side.     Heart  sounds: No murmur heard. Pulmonary:     Effort: Pulmonary effort is normal. No respiratory distress.     Breath sounds: Normal breath sounds.  Abdominal:     Palpations: Abdomen is soft. There is no hepatomegaly or mass.     Tenderness: There is no abdominal tenderness.  Genitourinary:    Comments: No concerns. Musculoskeletal:     Cervical back: No tenderness or bony tenderness. No pain with movement, spinous process tenderness or muscular tenderness. Decreased range of motion (mildly, rotation L>R).     Right lower leg: No edema.     Left lower leg: No edema.     Comments: No signs of synovitis appreciated.  Lymphadenopathy:     Cervical: No cervical adenopathy.  Skin:    General: Skin is warm.     Findings: No erythema or rash.  Neurological:     General: No focal deficit present.     Mental Status: She is alert and oriented to person, place, and time.     Cranial Nerves: No cranial nerve deficit.     Coordination: Coordination normal.     Gait: Gait normal.     Deep Tendon Reflexes:     Reflex Scores:      Bicep reflexes are 2+ on the right side and 2+ on the left side.      Patellar reflexes are 2+ on the right side and 2+ on the left side. Psychiatric:        Mood and Affect: Mood and affect normal.   ASSESSMENT AND PLAN: Ms. Dawn Huang was here today annual physical examination.  Orders Placed This Encounter  Procedures   Stool culture   Ova and parasite examination   Lipid Panel   Microalbumin / creatinine urine ratio   TSH   Hemoglobin A1c   Comprehensive metabolic panel   CBC   VITAMIN D 25 Hydroxy (Vit-D Deficiency, Fractures)   Lab Results  Component Value Date   TSH 5.91 (H) 07/21/2022   Lab Results  Component Value Date    HGBA1C 8.7 (H) 07/21/2022   Lab Results  Component Value Date   CREATININE 0.92 07/21/2022   BUN 12 07/21/2022   NA 142 07/21/2022   K 3.6 07/21/2022   CL 106 07/21/2022   CO2 25 07/21/2022   Lab Results  Component Value Date   ALT 13 07/21/2022   AST 12 07/21/2022   ALKPHOS 52 07/21/2022   BILITOT 1.1 07/21/2022   Lab Results  Component Value Date   MICROALBUR 2.3 (H) 07/21/2022   MICROALBUR 4.6 (H) 06/22/2021   Lab Results  Component Value Date   WBC 6.3 07/21/2022   HGB 13.3 07/21/2022   HCT 38.1 07/21/2022   MCV 89.7 07/21/2022   PLT 145.0 (L) 07/21/2022   Routine general medical examination at a health care facility Assessment & Plan: We discussed the importance of regular physical activity and healthy diet for prevention of chronic illness and/or complications. Preventive guidelines reviewed. Vaccination up to date. Ca++ and vit D supplementation to continue. Next CPE in a year.  Mixed hyperlipidemia Assessment & Plan: Last LDL 63 in 05/2020. Continue Atorvastatin 20 mg daily and low fat diet. Further recommendations according to FLP results.  Orders: -     Lipid panel; Future -     Comprehensive metabolic panel; Future  Type 2 diabetes mellitus with stage 3a chronic kidney disease, with long-term current use of insulin Assessment & Plan: HgA1C has not at goal, last HgA1C  was 7.9 in 06/2021. Jardiance 25 mg, Victoza 1.2 mg daily, and Metformin 1000 mg bid to continue. Further recommendations will be given according to HgA1C result. Annual eye exam, periodic dental and foot care to continue. F/U in 4 months.  Orders: -     Microalbumin / creatinine urine ratio; Future -     Hemoglobin A1c; Future -     Comprehensive metabolic panel; Future  Hypothyroidism, unspecified type Assessment & Plan: Problem has been well controlled. Continue Levothyroxine 100 mcg daily. Further recommendations according to TSH result.  Orders: -     TSH;  Future  Primary hypertension Assessment & Plan: BP is not well controlled. Re-checked 148/80. For now continue Metoprolol succinate 50 mg daily and Amlodipine-Benazepril 5-10 mg daily. Monitor BP at home and if persistently above 140/80, we will need to adjust treatment. Low salt diet to continue. Eye exam is current. F/U in 3-4 months,before if needed.  Diarrhea, unspecified type We discussed possible etiologies. Some of her chronic medical problems as well as medications could be contributing factors. History and examination do not suggest a serious process. Last colonoscopy on 07/28/2015, 10 years follow-up was recommended.  I find a copy of report. She is concerned about parasites, so stool culture ordered. Continue adequate hydration. If problem is persistent, we need to consider stopping metformin and/or GI evaluation.  -     Stool culture -     Ova and parasite examination; Future -     CBC; Future  Vitamin D deficiency, unspecified Assessment & Plan: Continue current dose of Vit D supplementation, not sure about dose. Further recommendations according to 25 OH vit D result.  Orders: -     VITAMIN D 25 Hydroxy (Vit-D Deficiency, Fractures); Future  Stage 3b chronic kidney disease Assessment & Plan: Problem has been stable. Cr 0.9-1.3, e GFR 40's,last one 55. Adequate BP/glucose control as well as hydration. Continue low salt diet and avoidance of NSAID's.  Orders: -     CBC; Future  Unilateral occipital headache ?  Tension-like headache. Further recommendations will be given according to lab results. I do not think imaging is needed at this time. Instructed about warning signs.  Recurrent major depressive disorder, in partial remission Assessment & Plan: Reported as well controlled, remission. She would like no changes, so continue Sertraline 100 mg daily.  Return in 4 months (on 11/20/2022) for chronic problems.  Vanette Noguchi G. [place], MD  [facility]. [place] office.

## 2022-07-21 ENCOUNTER — Encounter: Payer: Self-pay | Admitting: Family Medicine

## 2022-07-21 ENCOUNTER — Ambulatory Visit (INDEPENDENT_AMBULATORY_CARE_PROVIDER_SITE_OTHER): Payer: Medicare HMO | Admitting: Family Medicine

## 2022-07-21 VITALS — BP 148/80 | HR 67 | Temp 98.0°F | Resp 16 | Ht 64.17 in | Wt 171.2 lb

## 2022-07-21 DIAGNOSIS — Z794 Long term (current) use of insulin: Secondary | ICD-10-CM | POA: Diagnosis not present

## 2022-07-21 DIAGNOSIS — N1831 Chronic kidney disease, stage 3a: Secondary | ICD-10-CM | POA: Diagnosis not present

## 2022-07-21 DIAGNOSIS — Z0001 Encounter for general adult medical examination with abnormal findings: Secondary | ICD-10-CM

## 2022-07-21 DIAGNOSIS — E1122 Type 2 diabetes mellitus with diabetic chronic kidney disease: Secondary | ICD-10-CM

## 2022-07-21 DIAGNOSIS — F3341 Major depressive disorder, recurrent, in partial remission: Secondary | ICD-10-CM

## 2022-07-21 DIAGNOSIS — E039 Hypothyroidism, unspecified: Secondary | ICD-10-CM | POA: Diagnosis not present

## 2022-07-21 DIAGNOSIS — E559 Vitamin D deficiency, unspecified: Secondary | ICD-10-CM

## 2022-07-21 DIAGNOSIS — R197 Diarrhea, unspecified: Secondary | ICD-10-CM | POA: Diagnosis not present

## 2022-07-21 DIAGNOSIS — E782 Mixed hyperlipidemia: Secondary | ICD-10-CM

## 2022-07-21 DIAGNOSIS — N1832 Chronic kidney disease, stage 3b: Secondary | ICD-10-CM

## 2022-07-21 DIAGNOSIS — R519 Headache, unspecified: Secondary | ICD-10-CM

## 2022-07-21 DIAGNOSIS — I10 Essential (primary) hypertension: Secondary | ICD-10-CM | POA: Diagnosis not present

## 2022-07-21 DIAGNOSIS — Z0000 Encounter for general adult medical examination without abnormal findings: Secondary | ICD-10-CM | POA: Insufficient documentation

## 2022-07-21 LAB — CBC
HCT: 38.1 % (ref 36.0–46.0)
Hemoglobin: 13.3 g/dL (ref 12.0–15.0)
MCHC: 34.9 g/dL (ref 30.0–36.0)
MCV: 89.7 fl (ref 78.0–100.0)
Platelets: 145 10*3/uL — ABNORMAL LOW (ref 150.0–400.0)
RBC: 4.25 Mil/uL (ref 3.87–5.11)
RDW: 14.1 % (ref 11.5–15.5)
WBC: 6.3 10*3/uL (ref 4.0–10.5)

## 2022-07-21 LAB — MICROALBUMIN / CREATININE URINE RATIO
Creatinine,U: 58.7 mg/dL
Microalb Creat Ratio: 3.9 mg/g (ref 0.0–30.0)
Microalb, Ur: 2.3 mg/dL — ABNORMAL HIGH (ref 0.0–1.9)

## 2022-07-21 LAB — COMPREHENSIVE METABOLIC PANEL
ALT: 13 U/L (ref 0–35)
AST: 12 U/L (ref 0–37)
Albumin: 4.2 g/dL (ref 3.5–5.2)
Alkaline Phosphatase: 52 U/L (ref 39–117)
BUN: 12 mg/dL (ref 6–23)
CO2: 25 mEq/L (ref 19–32)
Calcium: 9.1 mg/dL (ref 8.4–10.5)
Chloride: 106 mEq/L (ref 96–112)
Creatinine, Ser: 0.92 mg/dL (ref 0.40–1.20)
GFR: 61.45 mL/min (ref >60.00)
Glucose, Bld: 206 mg/dL — ABNORMAL HIGH (ref 70–99)
Potassium: 3.6 mEq/L (ref 3.5–5.1)
Sodium: 142 mEq/L (ref 135–145)
Total Bilirubin: 1.1 mg/dL (ref 0.2–1.2)
Total Protein: 6.9 g/dL (ref 6.0–8.3)

## 2022-07-21 LAB — TSH: TSH: 5.91 u[IU]/mL — ABNORMAL HIGH (ref 0.35–5.50)

## 2022-07-21 LAB — LIPID PANEL
Cholesterol: 163 mg/dL (ref 0–200)
HDL: 52.7 mg/dL (ref >39.00)
NonHDL: 110.03
Total CHOL/HDL Ratio: 3
Triglycerides: 262 mg/dL — ABNORMAL HIGH (ref 0.0–149.0)
VLDL: 52.4 mg/dL — ABNORMAL HIGH (ref 0.0–40.0)

## 2022-07-21 LAB — HEMOGLOBIN A1C: Hgb A1c MFr Bld: 8.7 % — ABNORMAL HIGH (ref 4.6–6.5)

## 2022-07-21 LAB — LDL CHOLESTEROL, DIRECT: Direct LDL: 65 mg/dL

## 2022-07-21 LAB — VITAMIN D 25 HYDROXY (VIT D DEFICIENCY, FRACTURES): VITD: 48.39 ng/mL (ref 30.00–100.00)

## 2022-07-21 NOTE — Patient Instructions (Addendum)
A few things to remember from today's visit:  Routine general medical examination at a health care facility  Mixed hyperlipidemia - Plan: Lipid Panel, Comprehensive metabolic panel  Type 2 diabetes mellitus with stage 3a chronic kidney disease, with long-term current use of insulin - Plan: Microalbumin / creatinine urine ratio, Hemoglobin A1c, Comprehensive metabolic panel  Hypothyroidism, unspecified type - Plan: TSH  Primary hypertension  Diarrhea, unspecified type - Plan: Stool culture, Ova and parasite examination, CBC  Vitamin D deficiency, unspecified - Plan: VITAMIN D 25 Hydroxy (Vit-D Deficiency, Fractures)  Stage 3b chronic kidney disease - Plan: CBC  Unilateral occipital headache  No changes today. If diarrhea is persistent and labs are stable, we may need to decrease or stop metformin and an appt with gastroenterologist.Continue adequate hydration. Monitor blood pressure at home, if persistently above 140/89, we will need to adjust blood pressure medications.  If you need refills for medications you take chronically, please call your pharmacy. Do not use My Chart to request refills or for acute issues that need immediate attention. If you send a my chart message, it may take a few days to be addressed, specially if I am not in the office.  Please be sure medication list is accurate. If a new problem present, please set up appointment sooner than planned today.

## 2022-07-21 NOTE — Assessment & Plan Note (Signed)
Continue current dose of Vit D supplementation, not sure about dose. Further recommendations according to 25 OH vit D result.

## 2022-07-21 NOTE — Assessment & Plan Note (Signed)
BP is not well controlled. Re-checked 148/80. For now continue Metoprolol succinate 50 mg daily and Amlodipine-Benazepril 5-10 mg daily. Monitor BP at home and if persistently above 140/80, we will need to adjust treatment. Low salt diet to continue. Eye exam is current. F/U in 3-4 months,before if needed.

## 2022-07-21 NOTE — Assessment & Plan Note (Signed)
HgA1C has not at goal, last HgA1C was 7.9 in 06/2021. Jardiance 25 mg, Victoza 1.2 mg daily, and Metformin 1000 mg bid to continue. Further recommendations will be given according to HgA1C result. Annual eye exam, periodic dental and foot care to continue. F/U in 4 months.

## 2022-07-21 NOTE — Assessment & Plan Note (Signed)
Problem has been well controlled. Continue Levothyroxine 100 mcg daily. Further recommendations according to TSH result.

## 2022-07-21 NOTE — Assessment & Plan Note (Signed)
We discussed the importance of regular physical activity and healthy diet for prevention of chronic illness and/or complications. Preventive guidelines reviewed. Vaccination up to date. Ca++ and vit D supplementation to continue. Next CPE in a year. 

## 2022-07-21 NOTE — Assessment & Plan Note (Signed)
Last LDL 63 in 05/2020. Continue Atorvastatin 20 mg daily and low fat diet. Further recommendations according to FLP results.

## 2022-07-21 NOTE — Assessment & Plan Note (Addendum)
Problem has been stable up to 06/2021. Cr 1.1-1.2, e GFR 40's(44-46). Adequate BP/glucose control as well as hydration. Continue low salt diet and avoidance of NSAID's.

## 2022-07-21 NOTE — Assessment & Plan Note (Signed)
Reported as well controlled, remission. She would like no changes, so continue Sertraline 100 mg daily.

## 2022-08-31 ENCOUNTER — Telehealth: Payer: Self-pay | Admitting: Family Medicine

## 2022-08-31 NOTE — Telephone Encounter (Signed)
Contacted Glend[facility] Greb to schedule their annual wellness visit. Welcome to Medicare visit Due by 02/16/23.  Rudell Cobb AWV direct phone # [phone]   WTM 02/16/23 per [facility]

## 2022-11-21 ENCOUNTER — Ambulatory Visit: Payer: Medicare HMO | Admitting: Family Medicine

## 2023-03-06 ENCOUNTER — Telehealth: Payer: Self-pay | Admitting: Family Medicine

## 2023-03-06 NOTE — Telephone Encounter (Signed)
Patient returned my call  she stated she has changed providers to [facility]

## 2023-05-08 ENCOUNTER — Ambulatory Visit
Admission: EM | Admit: 2023-05-08 | Discharge: 2023-05-08 | Disposition: A | Discharge status: Home / Self Care | Payer: Medicare HMO | Attending: Family Medicine | Admitting: Family Medicine

## 2023-05-08 DIAGNOSIS — M542 Cervicalgia: Secondary | ICD-10-CM

## 2023-05-08 MED ORDER — TIZANIDINE HCL 4 MG PO CAPS: 4.0000 mg | ORAL_CAPSULE | Freq: Three times a day (TID) | ORAL | 0 refills | Status: AC

## 2023-05-08 MED ORDER — KETOROLAC TROMETHAMINE 30 MG/ML IJ SOLN
15.0000 mg | Freq: Once | INTRAMUSCULAR | Status: AC
  Administered 2023-05-08: 15 mg via INTRAMUSCULAR

## 2023-05-08 NOTE — Discharge Instructions (Signed)
Meds ordered this encounter  Medications   ketorolac (TORADOL) 30 MG/ML injection 15 mg   tiZANidine (ZANAFLEX) 4 MG capsule    Sig: Take 1 capsule (4 mg total) by mouth 3 (three) times daily.    Dispense:  30 capsule    Refill:  0

## 2023-05-08 NOTE — ED Triage Notes (Signed)
Pt c/o  neck and bilateral shoulder pain x 3 days. Patient has used ice and heat with no relief. Denies any recent trauma to her neck.

## 2023-05-10 NOTE — ED Provider Notes (Signed)
[facility]   [id number] 05/08/23 Arrival Time: 1140  ASSESSMENT & PLAN:  1. Bilateral posterior neck pain    Trial of: Meds ordered this encounter  Medications   ketorolac (TORADOL) 30 MG/ML injection 15 mg   tiZANidine (ZANAFLEX) 4 MG capsule    Sig: Take 1 capsule (4 mg total) by mouth 3 (three) times daily.    Dispense:  30 capsule    Refill:  0   Activities as tolerated.  Recommend:  Follow-up Information     [facility].   Specialty: Emergency Medicine Why: If symptoms worsen in any way. Contact information: [address] [place] [address] [phone]                Reviewed expectations re: course of current medical issues. Questions answered. Outlined signs and symptoms indicating need for more acute intervention. Patient verbalized understanding. After Visit Summary given.  SUBJECTIVE: History from: patient. Dawn Huang is a 73 y.o. female who reports post neck and upper back pain; grad onset; x 3 days; denies trauma/injury. No extremity sensation changes or weakness. Denies HA. No tx PTA.  Past Surgical History:  Procedure Laterality Date   IR GENERIC HISTORICAL  11/12/2015   IR VERTEBROPLASTY LUMBAR BX INC UNI/BIL INC/INJECT/IMAGING 11/12/2015 Julieanne Cotton, MD [facility]   IR GENERIC HISTORICAL  11/02/2015   IR RADIOLOGIST EVAL & MGMT 11/02/2015 Simonne Come, MD [facility]   TONSILLECTOMY  1968      OBJECTIVE:  Vitals:   05/08/23 1416  BP: 127/80  Pulse: 84  Resp: 18  Temp: 98 F (36.7 C)  TempSrc: Oral  SpO2: 98%    General appearance: alert; no distress HEENT: [place]; AT Neck: supple with FROM but moves slowly; mild TTP over bilateral post neck musculature extending over trapezius distribution Resp: unlabored respirations Extremities: normal movement of bilat UE CV: brisk extremity capillary refill of bilateral LE; 2+ radial pulse of bilateral  UE. Skin: warm and dry; no visible rashes Neurologic: gait normal; normal sensation and strength of bilateral UE Psychological: alert and cooperative; normal mood and affect  Imaging: No results found.    No Known Allergies  Past Medical History:  Diagnosis Date   Depression    Diabetes mellitus type 2, uncontrolled 2008   Hyperlipidemia    Hypertension    Hypothyroidism    Nephrolithiasis    Social History   Socioeconomic History   Marital status: Single    Spouse name: Not on file   Number of children: Not on file   Years of education: Not on file   Highest education level: Not on file  Occupational History   Not on file  Tobacco Use   Smoking status: Never   Smokeless tobacco: Never  Vaping Use   Vaping status: Never Used  Substance and Sexual Activity   Alcohol use: No    Alcohol/week: 0.0 standard drinks of alcohol   Drug use: No   Sexual activity: Not Currently  Other Topics Concern   Not on file  Social History Narrative   Patient is divorced, she was married twice in the past. She has a son age 28, daughter age 36 who lives in [place], daughter age 74 that lives in [place]   Patient moved to [place] area from [place] 2008   Social Drivers of Corporate investment banker Strain: Not on [employer] Insecurity: Not on file  Transportation Needs: Not on file  Physical Activity: Not on file  Stress: Not on file  Social Connections: Not on file   Family History  Problem Relation Age of Onset   Coronary artery disease Father    Coronary artery disease Brother    Breast cancer Daughter    Cancer Daughter        breast   Past Surgical History:  Procedure Laterality Date   IR GENERIC HISTORICAL  11/12/2015   IR VERTEBROPLASTY LUMBAR BX INC UNI/BIL INC/INJECT/IMAGING 11/12/2015 Julieanne Cotton, MD [facility]   IR GENERIC HISTORICAL  11/02/2015   IR RADIOLOGIST EVAL & MGMT 11/02/2015 Simonne Come, MD [facility]   TONSILLECTOMY  [id number]        Mardella Layman, MD 05/10/23 1352

## 2023-10-09 ENCOUNTER — Other Ambulatory Visit: Payer: Self-pay | Admitting: Registered Nurse

## 2023-10-09 DIAGNOSIS — R748 Abnormal levels of other serum enzymes: Secondary | ICD-10-CM

## 2023-10-22 ENCOUNTER — Ambulatory Visit
Admission: RE | Admit: 2023-10-22 | Discharge: 2023-10-22 | Disposition: A | Discharge status: Home / Self Care | Source: Ambulatory Visit | Attending: Registered Nurse | Admitting: Registered Nurse

## 2023-10-22 DIAGNOSIS — R748 Abnormal levels of other serum enzymes: Secondary | ICD-10-CM
